# Patient Record
Sex: Male | Born: 1980 | Race: Black or African American | Hispanic: No | Marital: Single | Smoking: Current every day smoker
Health system: Southern US, Community
[De-identification: ages and names within clinical notes are randomized; demographics above are authoritative.]

## PROBLEM LIST (undated history)

## (undated) DIAGNOSIS — I10 Essential (primary) hypertension: Secondary | ICD-10-CM

## (undated) DIAGNOSIS — I509 Heart failure, unspecified: Secondary | ICD-10-CM

## (undated) HISTORY — DX: Heart failure, unspecified: I50.9

---

## 2017-04-15 ENCOUNTER — Encounter: Payer: Self-pay | Admitting: *Deleted

## 2017-04-15 ENCOUNTER — Emergency Department
Admission: EM | Admit: 2017-04-15 | Discharge: 2017-04-15 | Disposition: A | Discharge status: Home / Self Care | Payer: Self-pay | Attending: Emergency Medicine | Admitting: Emergency Medicine

## 2017-04-15 DIAGNOSIS — H00011 Hordeolum externum right upper eyelid: Secondary | ICD-10-CM | POA: Insufficient documentation

## 2017-04-15 DIAGNOSIS — F17200 Nicotine dependence, unspecified, uncomplicated: Secondary | ICD-10-CM | POA: Insufficient documentation

## 2017-04-15 DIAGNOSIS — H01001 Unspecified blepharitis right upper eyelid: Secondary | ICD-10-CM | POA: Insufficient documentation

## 2017-04-15 MED ORDER — GENTAMICIN SULFATE 0.3 % OP SOLN: 2.0000 [drp] | Freq: Four times a day (QID) | OPHTHALMIC | 0 refills | Status: AC

## 2017-04-15 MED ORDER — KETOROLAC TROMETHAMINE 0.5 % OP SOLN: 1.0000 [drp] | Freq: Four times a day (QID) | OPHTHALMIC | 0 refills | Status: DC

## 2017-04-15 MED ORDER — FLUORESCEIN SODIUM 0.6 MG OP STRP
1.0000 | ORAL_STRIP | Freq: Once | OPHTHALMIC | Status: DC
  Filled 2017-04-15: qty 1

## 2017-04-15 NOTE — ED Triage Notes (Signed)
Pt has swelling to right eye lid for 3 days. No known injury.  No visual changes.  Pt alert.

## 2017-04-15 NOTE — Discharge Instructions (Signed)
Use the prescription eye drops as directed. Apply warm compresses to promote healing. Follow-up with provider or [facility].

## 2017-04-15 NOTE — ED Provider Notes (Signed)
[facility] Emergency Department Provider Note ____________________________________________  Time seen: 1818  I have reviewed the triage vital signs and the nursing notes.  HISTORY  Chief Complaint  Eye Pain   HPI Andrew Walters is a 36 y.o. male presents to the ED for evaluation of swelling to the right upper lid. He denies vision change, tearing, matting, or crusting. He reports a foreign body sensation on Monday while at work. He works as a Investment banker, operational, and initially thought he had gotten flour in his eye. Since that time, he has noted continued eye irritation and whitish, mucoid discharge.   History reviewed. No pertinent past medical history.  There are no active problems to display for this patient.  History reviewed. No pertinent surgical history.  Prior to Admission medications   Not on File   Allergies Patient has no known allergies.  No family history on file.  Social History Social History  Substance Use Topics  . Smoking status: Current Every Day Smoker  . Smokeless tobacco: Never Used  . Alcohol use No    Review of Systems  Constitutional: Negative for fever. Eyes: Negative for visual changes. Right upper lid swelling as above ENT: Negative for sore throat. Gastrointestinal: Negative for abdominal pain, vomiting and diarrhea. Skin: Negative for rash. Neurological: Negative for headaches, focal weakness or numbness. ____________________________________________  PHYSICAL EXAM:  VITAL SIGNS: ED Triage Vitals  Enc Vitals Group     BP 04/15/17 1744 (!) 161/86     Pulse Rate 04/15/17 1744 93     Resp 04/15/17 1744 18     Temp 04/15/17 1744 99 F (37.2 C)     Temp Source 04/15/17 1744 Oral     SpO2 04/15/17 1744 97 %     Weight 04/15/17 1745 225 lb (102.1 kg)     Height 04/15/17 1745  (1.803 m)     Head Circumference --      Peak Flow --      Pain Score 04/15/17 1752 8     Pain Loc --      Pain Edu? --      Excl. in GC? --      Constitutional: Alert and oriented. Well appearing and in no distress. Head: Normocephalic and atraumatic. Eyes: Conjunctivae are normal bilaterally. PERRL. Normal extraocular movements. Upper right lid everted to reveal some cobblestone appearance to the palpebral conjunctiva. Exam reveals a spontaneously draining upper lid stye with blepharitis. No fluorescein dye uptake.  Hematological/Lymphatic/Immunological: No preauricular lymphadenopathy. Cardiovascular: Normal distal pulses. Respiratory: Normal respiratory effort.  Neurologic:  Normal gait without ataxia. Normal speech and language. No gross focal neurologic deficits are appreciated. Skin:  Skin is warm, dry and intact. No rash noted. ____________________________________________  PROCEDURES    Visual Acuity  Right Eye Distance: 20/70 Left Eye Distance: 20/40 Bilateral Distance:   ____________________________________________  INITIAL IMPRESSION / ASSESSMENT AND PLAN / ED COURSE  Patient with ED evaluation of right upper lid blepharitis and stye. He is discharged with a prescription for gentamycin and ketorolac solution. He is referred to [facility] for follow-up if needed.  ____________________________________________  FINAL CLINICAL IMPRESSION(S) / ED DIAGNOSES  Final diagnoses:  Blepharitis of right upper eyelid, unspecified type  Hordeolum externum of right upper eyelid      Karmen Stabs, Charlesetta Ivory, PA-C 04/15/17 1910    Karmen Stabs, Charlesetta Ivory, PA-C 04/15/17 1912    Rockne Menghini, MD 04/15/17 [phone]

## 2017-04-15 NOTE — ED Notes (Signed)
See triage note  Developed swelling and some pain to right eye 2-3 days ago  Low grade fever   No visual changes

## 2017-07-13 ENCOUNTER — Other Ambulatory Visit: Payer: Self-pay

## 2017-07-13 ENCOUNTER — Emergency Department: Payer: Self-pay

## 2017-07-13 ENCOUNTER — Emergency Department
Admission: EM | Admit: 2017-07-13 | Discharge: 2017-07-13 | Disposition: A | Discharge status: Home / Self Care | Payer: Self-pay | Attending: Emergency Medicine | Admitting: Emergency Medicine

## 2017-07-13 DIAGNOSIS — R1084 Generalized abdominal pain: Secondary | ICD-10-CM | POA: Insufficient documentation

## 2017-07-13 DIAGNOSIS — J029 Acute pharyngitis, unspecified: Secondary | ICD-10-CM | POA: Insufficient documentation

## 2017-07-13 DIAGNOSIS — Z79899 Other long term (current) drug therapy: Secondary | ICD-10-CM | POA: Insufficient documentation

## 2017-07-13 DIAGNOSIS — J40 Bronchitis, not specified as acute or chronic: Secondary | ICD-10-CM | POA: Insufficient documentation

## 2017-07-13 DIAGNOSIS — F17210 Nicotine dependence, cigarettes, uncomplicated: Secondary | ICD-10-CM | POA: Insufficient documentation

## 2017-07-13 LAB — CBC WITH DIFFERENTIAL/PLATELET
BASOS ABS: 0 10*3/uL (ref 0–0.1)
BASOS PCT: 1 %
Eosinophils Absolute: 0 10*3/uL (ref 0–0.7)
Eosinophils Relative: 1 %
HEMATOCRIT: 45.6 % (ref 40.0–52.0)
HEMOGLOBIN: 15.5 g/dL (ref 13.0–18.0)
Lymphocytes Relative: 14 %
Lymphs Abs: 1.3 10*3/uL (ref 1.0–3.6)
MCH: 31.7 pg (ref 26.0–34.0)
MCHC: 34 g/dL (ref 32.0–36.0)
MCV: 93.3 fL (ref 80.0–100.0)
MONOS PCT: 17 %
Monocytes Absolute: 1.6 10*3/uL — ABNORMAL HIGH (ref 0.2–1.0)
NEUTROS ABS: 6.5 10*3/uL (ref 1.4–6.5)
NEUTROS PCT: 69 %
Platelets: 239 10*3/uL (ref 150–440)
RBC: 4.89 MIL/uL (ref 4.40–5.90)
RDW: 14.5 % (ref 11.5–14.5)
WBC: 9.5 10*3/uL (ref 3.8–10.6)

## 2017-07-13 LAB — LACTIC ACID, PLASMA
LACTIC ACID, VENOUS: 0.7 mmol/L (ref 0.5–1.9)
Lactic Acid, Venous: 0.8 mmol/L (ref 0.5–1.9)

## 2017-07-13 LAB — COMPREHENSIVE METABOLIC PANEL
ALK PHOS: 58 U/L (ref 38–126)
ALT: 20 U/L (ref 17–63)
ANION GAP: 10 (ref 5–15)
AST: 23 U/L (ref 15–41)
Albumin: 4.1 g/dL (ref 3.5–5.0)
BUN: 13 mg/dL (ref 6–20)
CALCIUM: 8.9 mg/dL (ref 8.9–10.3)
CHLORIDE: 101 mmol/L (ref 101–111)
CO2: 25 mmol/L (ref 22–32)
Creatinine, Ser: 1.37 mg/dL — ABNORMAL HIGH (ref 0.61–1.24)
GFR calc non Af Amer: >60 mL/min (ref >60)
Glucose, Bld: 110 mg/dL — ABNORMAL HIGH (ref 65–99)
Potassium: 3.6 mmol/L (ref 3.5–5.1)
SODIUM: 136 mmol/L (ref 135–145)
Total Bilirubin: 0.9 mg/dL (ref 0.3–1.2)
Total Protein: 8 g/dL (ref 6.5–8.1)

## 2017-07-13 LAB — URINALYSIS, COMPLETE (UACMP) WITH MICROSCOPIC
BACTERIA UA: NONE SEEN
BILIRUBIN URINE: NEGATIVE
Glucose, UA: NEGATIVE mg/dL
KETONES UR: NEGATIVE mg/dL
LEUKOCYTES UA: NEGATIVE
NITRITE: NEGATIVE
PROTEIN: 30 mg/dL — AB
Specific Gravity, Urine: 1.024 (ref 1.005–1.030)
pH: 6 (ref 5.0–8.0)

## 2017-07-13 LAB — INFLUENZA PANEL BY PCR (TYPE A & B)
INFLAPCR: NEGATIVE
Influenza B By PCR: NEGATIVE

## 2017-07-13 LAB — POCT RAPID STREP A: Streptococcus, Group A Screen (Direct): NEGATIVE

## 2017-07-13 MED ORDER — SODIUM CHLORIDE 0.9 % IV BOLUS (SEPSIS)
1000.0000 mL | Freq: Once | INTRAVENOUS | Status: AC
  Administered 2017-07-13: 1000 mL via INTRAVENOUS

## 2017-07-13 MED ORDER — CEFTRIAXONE SODIUM IN DEXTROSE 20 MG/ML IV SOLN
1.0000 g | Freq: Once | INTRAVENOUS | Status: AC
  Administered 2017-07-13: 1 g via INTRAVENOUS
  Filled 2017-07-13: qty 50

## 2017-07-13 MED ORDER — KETOROLAC TROMETHAMINE 30 MG/ML IJ SOLN
30.0000 mg | Freq: Once | INTRAMUSCULAR | Status: AC
  Administered 2017-07-13: 30 mg via INTRAVENOUS
  Filled 2017-07-13: qty 1

## 2017-07-13 MED ORDER — ONDANSETRON 4 MG PO TBDP: 4.0000 mg | ORAL_TABLET | Freq: Three times a day (TID) | ORAL | 0 refills | Status: DC | PRN

## 2017-07-13 MED ORDER — ACETAMINOPHEN 325 MG PO TABS
ORAL_TABLET | ORAL | Status: AC
  Administered 2017-07-13: 650 mg via ORAL
  Filled 2017-07-13: qty 2

## 2017-07-13 MED ORDER — IOPAMIDOL (ISOVUE-300) INJECTION 61%
100.0000 mL | Freq: Once | INTRAVENOUS | Status: AC | PRN
  Administered 2017-07-13: 100 mL via INTRAVENOUS

## 2017-07-13 MED ORDER — ACETAMINOPHEN 325 MG PO TABS
650.0000 mg | ORAL_TABLET | Freq: Once | ORAL | Status: AC | PRN
  Administered 2017-07-13: 650 mg via ORAL

## 2017-07-13 MED ORDER — AMOXICILLIN-POT CLAVULANATE 875-125 MG PO TABS: 1.0000 | ORAL_TABLET | Freq: Two times a day (BID) | ORAL | 0 refills | Status: AC

## 2017-07-13 MED ORDER — DEXTROSE 5 % IV SOLN
500.0000 mg | Freq: Once | INTRAVENOUS | Status: AC
  Administered 2017-07-13: 500 mg via INTRAVENOUS
  Filled 2017-07-13: qty 500

## 2017-07-13 NOTE — ED Notes (Signed)
Patient transported to CT 

## 2017-07-13 NOTE — ED Notes (Signed)
Flu swab sent.

## 2017-07-13 NOTE — ED Provider Notes (Addendum)
[facility] Emergency Department Provider Note    First MD Initiated Contact with Patient 07/13/17 1111     (approximate)  I have reviewed the triage vital signs and the nursing notes.   HISTORY  Chief Complaint Sore Throat; Back Pain; and Abdominal Pain    HPI Andrew Walters is a 36 y.o. male presents to the emergency department presents to the emergency department with medical complaints including cough, sore throat "flulike symptoms", generalized abdominal and low back pain, nausea vomiting and diarrhea times 1 week.  Patient noted to be febrile on presentation temperature 100.4.  Patient current pain score 9 out of 10.   Past medical history Influenza with pneumonia There are no active problems to display for this patient.   History reviewed. No pertinent surgical history.  Prior to Admission medications   Medication Sig Start Date End Date Taking? Authorizing Provider  ketorolac (ACULAR) 0.5 % ophthalmic solution Place 1 drop into the right eye 4 (four) times daily. 04/15/17   Menshew, Charlesetta Ivory, PA-C    Allergies No known drug allergies  No family history on file.  Social History Social History   Tobacco Use  . Smoking status: Current Every Day Smoker  . Smokeless tobacco: Never Used  Substance Use Topics  . Alcohol use: No  . Drug use: No    Review of Systems Constitutional: Positive for fever/chills Eyes: No visual changes. ENT: Positive for sore throat. Cardiovascular: Denies chest pain. Respiratory: Denies shortness of breath.  Positive for cough Gastrointestinal: Positive for abdominal pain nausea vomiting diarrhea genitourinary: Negative for dysuria. Musculoskeletal: Negative for neck pain.  Negative for back pain. Integumentary: Negative for rash. Neurological: Negative for headaches, focal weakness or numbness.   ____________________________________________   PHYSICAL EXAM:  VITAL SIGNS: ED Triage Vitals  Enc  Vitals Group     BP 07/13/17 1052 (!) 145/101     Pulse Rate 07/13/17 1052 (!) 108     Resp 07/13/17 1052 (!) 22     Temp 07/13/17 1052 (!) 100.4 F (38 C)     Temp Source 07/13/17 1052 Oral     SpO2 07/13/17 1052 98 %     Weight 07/13/17 1053 102.1 kg (225 lb)     Height 07/13/17 1053 1.803 m (5\' 11" )     Head Circumference --      Peak Flow --      Pain Score 07/13/17 1052 8     Pain Loc --      Pain Edu? --      Excl. in GC? --     Constitutional: Alert and oriented. Well appearing and in no acute distress. Eyes: Conjunctivae are normal.  Head: Atraumatic. Mouth/Throat: Mucous membranes are moist.  Pharyngeal erythema no exudates noted.   Neck: No stridor.  No meningeal signs.  Cardiovascular: Normal rate, regular rhythm. Good peripheral circulation. Grossly normal heart sounds. Respiratory: Normal respiratory effort.  No retractions. Lungs CTAB. Gastrointestinal: Generalized tenderness to palpation no distention.  Musculoskeletal: No lower extremity tenderness nor edema. No gross deformities of extremities. Neurologic:  Normal speech and language. No gross focal neurologic deficits are appreciated.  Skin:  Skin is warm, dry and intact. No rash noted. Psychiatric: Mood and affect are normal. Speech and behavior are normal.  ____________________________________________   LABS (all labs ordered are listed, but only abnormal results are displayed)  Labs Reviewed  CBC WITH DIFFERENTIAL/PLATELET - Abnormal; Notable for the following components:      Result  Value   Monocytes Absolute 1.6 (*)    All other components within normal limits  URINALYSIS, COMPLETE (UACMP) WITH MICROSCOPIC - Abnormal; Notable for the following components:   Color, Urine YELLOW (*)    APPearance HAZY (*)    Hgb urine dipstick MODERATE (*)    Protein, ur 30 (*)    Squamous Epithelial / LPF 0-5 (*)    All other components within normal limits  LACTIC ACID, PLASMA  LACTIC ACID, PLASMA    COMPREHENSIVE METABOLIC PANEL  INFLUENZA PANEL BY PCR (TYPE A & B)   __  RADIOLOGY I, [place] N BROWN, personally viewed and evaluated these images (plain radiographs) as part of my medical decision making, as well as reviewing the written report by the radiologist.  CLINICAL DATA:  36 year old male with sore throat and flu-like symptoms with lower back pain. Nausea and vomiting with bilateral lower abdominal pain for the past week. Initial encounter.  EXAM: CT ABDOMEN AND PELVIS WITH CONTRAST  TECHNIQUE: Multidetector CT imaging of the abdomen and pelvis was performed using the standard protocol following bolus administration of intravenous contrast.  CONTRAST:  100mL ISOVUE-300 IOPAMIDOL (ISOVUE-300) INJECTION 61%  COMPARISON:  None.  FINDINGS: Lower chest: No worrisome lung base abnormality. Heart size top-normal.  Hepatobiliary: No worrisome hepatic lesion or calcified gallstones  Pancreas: No pancreatic mass or inflammation  Spleen: No splenic mass or enlargement  Adrenals/Urinary Tract: No obstructing stone or hydronephrosis. Tiny nonobstructing right renal calculi. Bilateral renal low-density structures some of which are cysts and others too small to characterize but statistically likely cysts.  No adrenal mass.  Noncontrast filled views of the urinary bladder unremarkable  Stomach/Bowel: No extraluminal bowel inflammatory process identified. Specifically, no inflammation surrounds the appendix.  Fluid-filled small bowel loops may be normal versus changes of enteritis.  No gastric abnormality identified.  Vascular/Lymphatic: No aortic aneurysm or large vessel occlusion. Duplicated right renal artery incidentally noted.  No adenopathy.  Reproductive: No worrisome abnormality  Other: No free air or bowel containing hernia.  Musculoskeletal: No worrisome osseous lesion.  IMPRESSION: No extraluminal bowel inflammatory process  identified. Specifically, no inflammation surrounds the appendix.  Fluid-filled small bowel loops may be normal versus changes of enteritis.  No obstructing stone or hydronephrosis. Tiny nonobstructing right renal calculi.  Bilateral renal low-density structures some of which are cysts and others too small to characterize but statistically likely cysts.   Electronically Signed   By: Lacy DuverneySteven  Olson M.D.   On: 07/13/2017 13:37   Procedures   ____________________________________________   INITIAL IMPRESSION / ASSESSMENT AND PLAN / ED COURSE  As part of my medical decision making, I reviewed the following data within the electronic MEDICAL RECORD NUMBER [id number] year old male presented with above-stated history and physical exam concerning for possible pharyngitis bronchitis most likely viral etiology.  Influenza negative laboratory data unremarkable with exception of creatinine 1.37.  Patient received 2 L IV normal saline and Toradol IV with marked improvement of pain except for abdominal discomfort as such CT scan of the abdomen performed which was consistent with enteritis.    ____________________________________________  FINAL CLINICAL IMPRESSION(S) / ED DIAGNOSES  Final diagnoses:  Pharyngitis, unspecified etiology  Bronchitis     MEDICATIONS GIVEN DURING THIS VISIT:  Medications  sodium chloride 0.9 % bolus 1,000 mL (not administered)  sodium chloride 0.9 % bolus 1,000 mL (not administered)  ketorolac (TORADOL) 30 MG/ML injection 30 mg (not administered)  acetaminophen (TYLENOL) tablet 650 mg (650 mg Oral Given 07/13/17 1059)  ED Discharge Orders    None       Note:  This document was prepared using Dragon voice recognition software and may include unintentional dictation errors.    Darci CurrentBrown, [place] N, MD 07/21/17 2049    Darci CurrentBrown, [place] N, MD 07/21/17 2049

## 2017-07-13 NOTE — ED Triage Notes (Addendum)
Pt to ER via POV c/o sore throat, flu like sx, lower back pain, NV, bilateral lower abdominal pain X 1 week. No medications PTA.

## 2017-11-12 ENCOUNTER — Emergency Department
Admission: EM | Admit: 2017-11-12 | Discharge: 2017-11-13 | Disposition: A | Discharge status: Home / Self Care | Payer: Self-pay | Attending: Student in an Organized Health Care Education/Training Program | Admitting: Student in an Organized Health Care Education/Training Program

## 2017-11-12 ENCOUNTER — Other Ambulatory Visit: Payer: Self-pay

## 2017-11-12 DIAGNOSIS — Y92000 Kitchen of unspecified non-institutional (private) residence as  the place of occurrence of the external cause: Secondary | ICD-10-CM | POA: Insufficient documentation

## 2017-11-12 DIAGNOSIS — W260XXA Contact with knife, initial encounter: Secondary | ICD-10-CM | POA: Insufficient documentation

## 2017-11-12 DIAGNOSIS — F17200 Nicotine dependence, unspecified, uncomplicated: Secondary | ICD-10-CM | POA: Insufficient documentation

## 2017-11-12 DIAGNOSIS — Y999 Unspecified external cause status: Secondary | ICD-10-CM | POA: Insufficient documentation

## 2017-11-12 DIAGNOSIS — Z23 Encounter for immunization: Secondary | ICD-10-CM | POA: Insufficient documentation

## 2017-11-12 DIAGNOSIS — S61226A Laceration with foreign body of right little finger without damage to nail, initial encounter: Secondary | ICD-10-CM | POA: Insufficient documentation

## 2017-11-12 DIAGNOSIS — Y93G1 Activity, food preparation and clean up: Secondary | ICD-10-CM | POA: Insufficient documentation

## 2017-11-12 DIAGNOSIS — S61216A Laceration without foreign body of right little finger without damage to nail, initial encounter: Secondary | ICD-10-CM

## 2017-11-12 MED ORDER — CEPHALEXIN 500 MG PO CAPS: 500.0000 mg | ORAL_CAPSULE | Freq: Four times a day (QID) | ORAL | 0 refills | Status: AC

## 2017-11-12 MED ORDER — CEPHALEXIN 500 MG PO CAPS
500.0000 mg | ORAL_CAPSULE | Freq: Once | ORAL | Status: AC
  Administered 2017-11-12: 500 mg via ORAL
  Filled 2017-11-12: qty 1

## 2017-11-12 MED ORDER — TETANUS-DIPHTH-ACELL PERTUSSIS 5-2.5-18.5 LF-MCG/0.5 IM SUSP
0.5000 mL | Freq: Once | INTRAMUSCULAR | Status: AC
  Administered 2017-11-13: 0.5 mL via INTRAMUSCULAR
  Filled 2017-11-12: qty 0.5

## 2017-11-12 NOTE — Discharge Instructions (Signed)
Please keep hand clean and dry for 1 week.  Take antibiotics as prescribed.  If any redness, drainage, is increase in swelling return to the ED.

## 2017-11-12 NOTE — ED Provider Notes (Signed)
[facility] EMERGENCY DEPARTMENT Provider Note   CSN: [id number] Arrival date & time: 11/12/17  2319     History   Chief Complaint Chief Complaint  Patient presents with  . Laceration    HPI Andrew Walters is a 37 y.o. male presents to the emergency department for evaluation of laceration to the right fifth digit.  Patient states he was washing dishes at home and may be cut his fifth digit on a knife.  Laceration is along the base of the right fifth digit at the MCP joint.  No loss of flexion of the fifth digit.  No numbness or tingling.  Laceration occurred just prior to arrival.  His tetanus is not up-to-date.  HPI  No past medical history on file.  There are no active problems to display for this patient.   No past surgical history on file.      Home Medications    Prior to Admission medications   Medication Sig Start Date End Date Taking? Authorizing Provider  cephALEXin (KEFLEX) 500 MG capsule Take 1 capsule (500 mg total) by mouth 4 (four) times daily for 10 days. 11/12/17 11/22/17  Evon Slack, PA-C  ketorolac (ACULAR) 0.5 % ophthalmic solution Place 1 drop into the right eye 4 (four) times daily. 04/15/17   Menshew, Charlesetta Ivory, PA-C  ondansetron (ZOFRAN ODT) 4 MG disintegrating tablet Take 1 tablet (4 mg total) by mouth every 8 (eight) hours as needed for nausea or vomiting. 07/13/17   Darci Current, MD    Family History No family history on file.  Social History Social History   Tobacco Use  . Smoking status: Current Every Day Smoker  . Smokeless tobacco: Never Used  Substance Use Topics  . Alcohol use: No  . Drug use: No     Allergies   Patient has no known allergies.   Review of Systems Review of Systems  Constitutional: Negative for fever.  Musculoskeletal: Negative for arthralgias, back pain, gait problem, joint swelling, myalgias and neck pain.  Skin: Positive for wound.  Neurological: Negative for numbness.      Physical Exam Updated Vital Signs BP (!) 151/98   Pulse 96   Temp 98 F (36.7 C) (Oral)   Resp 16   Ht 5\' 11"  (1.803 m)   Wt 93 kg (205 lb)   SpO2 100%   BMI 28.59 kg/m   Physical Exam  Constitutional: He is oriented to person, place, and time. He appears well-developed and well-nourished.  HENT:  Head: Normocephalic and atraumatic.  Eyes: Conjunctivae are normal.  Neck: Normal range of motion.  Cardiovascular: Normal rate.  Pulmonary/Chest: Effort normal. No respiratory distress.  Musculoskeletal: Normal range of motion.  Small flap-like laceration along the volar aspect of the right fifth digit at the base of the proximal phalanx.  Full active flexion.  Sensation intact distally.  No visible or palpable foreign body.  Hand soaked in Betadine and saline.  Bleeding well controlled.  Neurological: He is alert and oriented to person, place, and time.  Skin: Skin is warm. No rash noted.  Psychiatric: He has a normal mood and affect. His behavior is normal. Thought content normal.     ED Treatments / Results  Labs (all labs ordered are listed, but only abnormal results are displayed) Labs Reviewed - No data to display  EKG None  Radiology No results found.  Procedures .Marland KitchenLaceration Repair Date/Time: 11/12/2017 11:58 PM Performed by: Evon Slack, PA-C Authorized by:  Evon SlackGaines, Brandie Lopes C, PA-C   Consent:    Consent obtained:  Verbal   Consent given by:  Patient   Risks discussed:  Infection, pain, retained foreign body, poor cosmetic result and poor wound healing Repair type:    Repair type:  Simple Exploration:    Hemostasis achieved with:  Direct pressure   Wound exploration: entire depth of wound probed and visualized     Contaminated: no   Treatment:    Area cleansed with:  Saline and Betadine   Amount of cleaning:  Extensive   Irrigation solution:  Sterile saline   Visualized foreign bodies/material removed: no   Skin repair:    Repair method:   Tissue adhesive Approximation:    Approximation:  Close Post-procedure details:    Dressing:  Sterile dressing   Patient tolerance of procedure:  Tolerated well, no immediate complications   (including critical care time)  Medications Ordered in ED Medications  cephALEXin (KEFLEX) capsule 500 mg (has no administration in time range)  Tdap (BOOSTRIX) injection 0.5 mL (has no administration in time range)     Initial Impression / Assessment and Plan / ED Course  I have reviewed the triage vital signs and the nursing notes.  Pertinent labs & imaging results that were available during my care of the patient were reviewed by me and considered in my medical decision making (see chart for details).     37 year old male with laceration to the volar aspect of the right fifth digit.  No tendon deficits noted.  Sensation intact distally.  Wound thoroughly cleansed and repaired with Dermabond.  Band-Aid applied.  Patient educated on wound care.  He is placed on prophylactic antibiotics due to dirty dishwater contamination.  He is educated on signs symptoms return to ED for.  Final Clinical Impressions(s) / ED Diagnoses   Final diagnoses:  Laceration of right little finger without foreign body without damage to nail, initial encounter    ED Discharge Orders        Ordered    cephALEXin (KEFLEX) 500 MG capsule  4 times daily     11/12/17 2339       Ronnette JuniperGaines, Jahmeek Shirk C, PA-C 11/12/17 2359    Willy Eddyobinson, Patrick, MD 11/14/17 1112

## 2017-11-12 NOTE — ED Triage Notes (Signed)
Pt lacerated base of right fifth finger with silverware at 2300. Bleeding controlled, cms intact to fingers.

## 2019-04-04 ENCOUNTER — Emergency Department
Admission: EM | Admit: 2019-04-04 | Discharge: 2019-04-04 | Disposition: A | Discharge status: Home / Self Care | Payer: Self-pay | Attending: Emergency Medicine | Admitting: Emergency Medicine

## 2019-04-04 ENCOUNTER — Other Ambulatory Visit: Payer: Self-pay

## 2019-04-04 DIAGNOSIS — R197 Diarrhea, unspecified: Secondary | ICD-10-CM | POA: Insufficient documentation

## 2019-04-04 DIAGNOSIS — F17200 Nicotine dependence, unspecified, uncomplicated: Secondary | ICD-10-CM | POA: Insufficient documentation

## 2019-04-04 DIAGNOSIS — Z20828 Contact with and (suspected) exposure to other viral communicable diseases: Secondary | ICD-10-CM | POA: Insufficient documentation

## 2019-04-04 DIAGNOSIS — R112 Nausea with vomiting, unspecified: Secondary | ICD-10-CM | POA: Insufficient documentation

## 2019-04-04 LAB — COMPREHENSIVE METABOLIC PANEL
ALT: 18 U/L (ref 0–44)
AST: 24 U/L (ref 15–41)
Albumin: 4.5 g/dL (ref 3.5–5.0)
Alkaline Phosphatase: 66 U/L (ref 38–126)
Anion gap: 11 (ref 5–15)
BUN: 10 mg/dL (ref 6–20)
CO2: 24 mmol/L (ref 22–32)
Calcium: 9 mg/dL (ref 8.9–10.3)
Chloride: 102 mmol/L (ref 98–111)
Creatinine, Ser: 1.15 mg/dL (ref 0.61–1.24)
GFR calc Af Amer: >60 mL/min (ref >60)
GFR calc non Af Amer: >60 mL/min (ref >60)
Glucose, Bld: 105 mg/dL — ABNORMAL HIGH (ref 70–99)
Potassium: 3.2 mmol/L — ABNORMAL LOW (ref 3.5–5.1)
Sodium: 137 mmol/L (ref 135–145)
Total Bilirubin: 0.8 mg/dL (ref 0.3–1.2)
Total Protein: 8 g/dL (ref 6.5–8.1)

## 2019-04-04 LAB — CBC
HCT: 43.7 % (ref 39.0–52.0)
Hemoglobin: 15.2 g/dL (ref 13.0–17.0)
MCH: 31.9 pg (ref 26.0–34.0)
MCHC: 34.8 g/dL (ref 30.0–36.0)
MCV: 91.8 fL (ref 80.0–100.0)
Platelets: 270 10*3/uL (ref 150–400)
RBC: 4.76 MIL/uL (ref 4.22–5.81)
RDW: 13.9 % (ref 11.5–15.5)
WBC: 14.4 10*3/uL — ABNORMAL HIGH (ref 4.0–10.5)
nRBC: 0 % (ref 0.0–0.2)

## 2019-04-04 LAB — URINALYSIS, COMPLETE (UACMP) WITH MICROSCOPIC
Bilirubin Urine: NEGATIVE
Glucose, UA: NEGATIVE mg/dL
Ketones, ur: NEGATIVE mg/dL
Leukocytes,Ua: NEGATIVE
Nitrite: NEGATIVE
Protein, ur: NEGATIVE mg/dL
Specific Gravity, Urine: 1.012 (ref 1.005–1.030)
pH: 6 (ref 5.0–8.0)

## 2019-04-04 LAB — LIPASE, BLOOD: Lipase: 18 U/L (ref 11–51)

## 2019-04-04 LAB — SARS CORONAVIRUS 2 BY RT PCR (HOSPITAL ORDER, PERFORMED IN ~~LOC~~ HOSPITAL LAB): SARS Coronavirus 2: NEGATIVE

## 2019-04-04 MED ORDER — SODIUM CHLORIDE 0.9 % IV BOLUS
1000.0000 mL | Freq: Once | INTRAVENOUS | Status: AC
  Administered 2019-04-04: 14:00:00 1000 mL via INTRAVENOUS

## 2019-04-04 MED ORDER — SODIUM CHLORIDE 0.9 % IV BOLUS
1000.0000 mL | Freq: Once | INTRAVENOUS | Status: AC
  Administered 2019-04-04: 15:00:00 1000 mL via INTRAVENOUS

## 2019-04-04 MED ORDER — ONDANSETRON 4 MG PO TBDP: 4.0000 mg | ORAL_TABLET | Freq: Three times a day (TID) | ORAL | 0 refills | Status: DC | PRN

## 2019-04-04 MED ORDER — AMLODIPINE BESYLATE 5 MG PO TABS
10.0000 mg | ORAL_TABLET | Freq: Once | ORAL | Status: AC
  Administered 2019-04-04: 15:00:00 10 mg via ORAL
  Filled 2019-04-04: qty 2

## 2019-04-04 MED ORDER — MORPHINE SULFATE (PF) 4 MG/ML IV SOLN
4.0000 mg | Freq: Once | INTRAVENOUS | Status: AC
  Administered 2019-04-04: 14:00:00 4 mg via INTRAVENOUS
  Filled 2019-04-04: qty 1

## 2019-04-04 MED ORDER — ACETAMINOPHEN 500 MG PO TABS
1000.0000 mg | ORAL_TABLET | Freq: Once | ORAL | Status: AC
  Administered 2019-04-04: 18:00:00 1000 mg via ORAL
  Filled 2019-04-04: qty 2

## 2019-04-04 MED ORDER — LOPERAMIDE HCL 2 MG PO TABS: 2.0000 mg | ORAL_TABLET | Freq: Four times a day (QID) | ORAL | 0 refills | Status: DC | PRN

## 2019-04-04 MED ORDER — AMLODIPINE BESYLATE 10 MG PO TABS: 10.0000 mg | ORAL_TABLET | Freq: Every day | ORAL | 1 refills | Status: DC

## 2019-04-04 MED ORDER — ONDANSETRON HCL 4 MG/2ML IJ SOLN
4.0000 mg | Freq: Once | INTRAMUSCULAR | Status: AC
  Administered 2019-04-04: 14:00:00 4 mg via INTRAVENOUS
  Filled 2019-04-04: qty 2

## 2019-04-04 NOTE — ED Notes (Signed)
Dr. Kerman Passey informed of elevated BP, see new orders

## 2019-04-04 NOTE — ED Notes (Signed)
Pt c/o body aches returning, temp 99.4, Dr. Kerman Passey informed, verbal given for 1g tylenol PO

## 2019-04-04 NOTE — ED Notes (Signed)
covid swab walked to the lab

## 2019-04-04 NOTE — ED Notes (Signed)
Pt advised that urine sample is needed, pt voices understanding

## 2019-04-04 NOTE — ED Triage Notes (Addendum)
Pt arrives via [facility] from home for nausea and vomiting since yesterday afternoon. Pt reports body aches and chills. Pt reports he has not had any known exposure to covid. PT A&OX3 in NAD. Reports continuous vomiting and diarrhea. Pt hypertensive in route per EMS, BP 182/116 on arrival. PT c/o congestion and cough x 1 week

## 2019-04-04 NOTE — Discharge Instructions (Signed)
As we discussed please take your blood pressure medication each day, follow-up with a primary care doctor in the next 2 weeks for recheck/reevaluation.  Please check your blood pressure each day in write down the values to bring to your primary care doctor.  Please take your nausea medicine and antidiarrheal medication as needed as written.  Please drink plenty of fluids and obtain plenty of rest.  Return to the emergency department for any symptom personally concerning to yourself.

## 2019-04-04 NOTE — ED Provider Notes (Signed)
[facility] Emergency Department Provider Note  Time seen: 1:32 PM  I have reviewed the triage vital signs and the nursing notes.   HISTORY  Chief Complaint Emesis   HPI Mikle BosworthRyan Hefty is a 38 y.o. male with no significant past medical history presents to the emergency department for body aches cough nausea vomiting diarrhea.  According to the patient since this morning he has been nauseated frequent episodes of vomiting and diarrhea.  Developed diffuse body aches subjective fever and occasional dry cough per patient.  No known sick contacts.  No chest pain.  States mild upper abdominal discomfort due to vomiting per patient.   History reviewed. No pertinent past medical history.  There are no active problems to display for this patient.   History reviewed. No pertinent surgical history.  Prior to Admission medications   Not on File    No Known Allergies  History reviewed. No pertinent family history.  Social History Social History   Tobacco Use  . Smoking status: Current Every Day Smoker  . Smokeless tobacco: Never Used  Substance Use Topics  . Alcohol use: No  . Drug use: No    Review of Systems Constitutional: Subjective low-grade fever ENT: Negative for recent illness/congestion Cardiovascular: Negative for chest pain. Respiratory: Negative for shortness of breath.  Positive for cough. Gastrointestinal: Mild dull upper abdominal discomfort which patient relates to vomiting.  Positive for nausea vomiting diarrhea since this morning. Genitourinary: Negative for urinary compaints Musculoskeletal: Positive for body aches. Skin: Negative for skin complaints  Neurological: Negative for headache All other ROS negative  ____________________________________________   PHYSICAL EXAM:  VITAL SIGNS: ED Triage Vitals  Enc Vitals Group     BP 04/04/19 1309 (!) 182/116     Pulse Rate 04/04/19 1309 95     Resp 04/04/19 1309 (!) 22     Temp  04/04/19 1309 98.5 F (36.9 C)     Temp Source 04/04/19 1309 Oral     SpO2 04/04/19 1309 96 %     Weight 04/04/19 1310 225 lb (102.1 kg)     Height 04/04/19 1310 5\' 11"  (1.803 m)     Head Circumference --      Peak Flow --      Pain Score 04/04/19 1309 8     Pain Loc --      Pain Edu? --      Excl. in GC? --    Constitutional: Alert and oriented. Well appearing and in no distress. Eyes: Normal exam ENT      Head: Normocephalic and atraumatic.      Mouth/Throat: Mucous membranes are moist. Cardiovascular: Normal rate, regular rhythm. No murmur Respiratory: Normal respiratory effort without tachypnea nor retractions. Breath sounds are clear  Gastrointestinal: Soft and nontender. No distention.   Musculoskeletal: Nontender with normal range of motion in all extremities.  Neurologic:  Normal speech and language. No gross focal neurologic deficits Skin:  Skin is warm, dry and intact.  Psychiatric: Mood and affect are normal.   ____________________________________________   INITIAL IMPRESSION / ASSESSMENT AND PLAN / ED COURSE  Pertinent labs & imaging results that were available during my care of the patient were reviewed by me and considered in my medical decision making (see chart for details).   Patient presents emergency department for nausea vomiting diarrhea upper abdominal discomfort, body aches subjective fever and dry cough.  Differential would include viral infection such as COVID-19, gastroenteritis, gastritis, other infectious etiology such as UTI.  We will check labs, IV hydrate, treat pain and nausea while awaiting lab results.  Patient agreeable to plan of care.  We will also check for COVID.  Patient's work-up is largely reassuring besides mild leukocytosis.  Patient is quite hypertensive states he is not taking any medications currently.  We will start the patient on Norvasc 10 mg daily and the patient is to check his blood pressure each day keep a journal and follow-up  with a primary care doctor within the next 2 to 3 weeks.  Patient agreeable.  I discussed hydration and supportive care at home.  We will discharge with nausea medication and loperamide.  Discussed return precautions.  Caison Hearn was evaluated in Emergency Department on 04/04/2019 for the symptoms described in the history of present illness. He was evaluated in the context of the global COVID-19 pandemic, which necessitated consideration that the patient might be at risk for infection with the SARS-CoV-2 virus that causes COVID-19. Institutional protocols and algorithms that pertain to the evaluation of patients at risk for COVID-19 are in a state of rapid change based on information released by regulatory bodies including the CDC and federal and state organizations. These policies and algorithms were followed during the patient's care in the ED.  ____________________________________________   FINAL CLINICAL IMPRESSION(S) / ED DIAGNOSES  Nausea vomiting diarrhea Hypertension   Harvest Dark, MD 04/04/19 1645

## 2019-04-05 ENCOUNTER — Encounter: Payer: Self-pay | Admitting: Emergency Medicine

## 2019-04-05 ENCOUNTER — Emergency Department
Admission: EM | Admit: 2019-04-05 | Discharge: 2019-04-05 | Disposition: A | Discharge status: Home / Self Care | Payer: Self-pay | Attending: Emergency Medicine | Admitting: Emergency Medicine

## 2019-04-05 DIAGNOSIS — F17210 Nicotine dependence, cigarettes, uncomplicated: Secondary | ICD-10-CM | POA: Insufficient documentation

## 2019-04-05 DIAGNOSIS — K529 Noninfective gastroenteritis and colitis, unspecified: Secondary | ICD-10-CM | POA: Insufficient documentation

## 2019-04-05 LAB — COMPREHENSIVE METABOLIC PANEL
ALT: 18 U/L (ref 0–44)
AST: 21 U/L (ref 15–41)
Albumin: 4.9 g/dL (ref 3.5–5.0)
Alkaline Phosphatase: 72 U/L (ref 38–126)
Anion gap: 12 (ref 5–15)
BUN: 10 mg/dL (ref 6–20)
CO2: 25 mmol/L (ref 22–32)
Calcium: 9.4 mg/dL (ref 8.9–10.3)
Chloride: 102 mmol/L (ref 98–111)
Creatinine, Ser: 1.15 mg/dL (ref 0.61–1.24)
GFR calc Af Amer: >60 mL/min (ref >60)
GFR calc non Af Amer: >60 mL/min (ref >60)
Glucose, Bld: 126 mg/dL — ABNORMAL HIGH (ref 70–99)
Potassium: 3.3 mmol/L — ABNORMAL LOW (ref 3.5–5.1)
Sodium: 139 mmol/L (ref 135–145)
Total Bilirubin: 0.8 mg/dL (ref 0.3–1.2)
Total Protein: 9.2 g/dL — ABNORMAL HIGH (ref 6.5–8.1)

## 2019-04-05 LAB — CBC
HCT: 49.4 % (ref 39.0–52.0)
Hemoglobin: 16.7 g/dL (ref 13.0–17.0)
MCH: 31.3 pg (ref 26.0–34.0)
MCHC: 33.8 g/dL (ref 30.0–36.0)
MCV: 92.5 fL (ref 80.0–100.0)
Platelets: 287 10*3/uL (ref 150–400)
RBC: 5.34 MIL/uL (ref 4.22–5.81)
RDW: 13.6 % (ref 11.5–15.5)
WBC: 13.1 10*3/uL — ABNORMAL HIGH (ref 4.0–10.5)
nRBC: 0 % (ref 0.0–0.2)

## 2019-04-05 LAB — LIPASE, BLOOD: Lipase: 23 U/L (ref 11–51)

## 2019-04-05 NOTE — ED Provider Notes (Signed)
[facility] Emergency Department Provider Note   ____________________________________________    I have reviewed the triage vital signs and the nursing notes.   HISTORY  Chief Complaint Back Pain, Generalized Body Aches, and Emesis     HPI Andrew Walters is a 38 y.o. male who presents with complaints of nausea vomiting diarrhea, abdominal cramping, body aches.  Patient was seen here yesterday for similar symptoms.  He is frustrated that he is not feeling better today.  Did have screening COVID swab yesterday which was negative.  Denies fevers.  Abdomen feels sore, diffusely.  Nonbloody vomitus, nonbloody stools.  Has been taking Tylenol occasionally  History reviewed. No pertinent past medical history.  There are no active problems to display for this patient.   History reviewed. No pertinent surgical history.  Prior to Admission medications   Medication Sig Start Date End Date Taking? Authorizing Provider  amLODipine (NORVASC) 10 MG tablet Take 1 tablet (10 mg total) by mouth daily. 04/04/19 06/03/19  Minna AntisPaduchowski, Kevin, MD  loperamide (IMODIUM A-D) 2 MG tablet Take 1 tablet (2 mg total) by mouth 4 (four) times daily as needed for diarrhea or loose stools. 04/04/19   Minna AntisPaduchowski, Kevin, MD  ondansetron (ZOFRAN ODT) 4 MG disintegrating tablet Take 1 tablet (4 mg total) by mouth every 8 (eight) hours as needed for nausea or vomiting. 04/04/19   Minna AntisPaduchowski, Kevin, MD     Allergies Patient has no known allergies.  No family history on file.  Social History Social History   Tobacco Use  . Smoking status: Current Every Day Smoker  . Smokeless tobacco: Never Used  Substance Use Topics  . Alcohol use: No  . Drug use: No    Review of Systems  Constitutional: No fever/chills Eyes: No visual changes.  ENT: No sore throat. Cardiovascular: Denies chest pain. Respiratory: Denies shortness of breath. Gastrointestinal: As above Genitourinary: Negative  for dysuria. Musculoskeletal: Negative for back pain. Skin: Negative for rash. Neurological: Negative for headaches   ____________________________________________   PHYSICAL EXAM:  VITAL SIGNS: ED Triage Vitals  Enc Vitals Group     BP 04/05/19 1124 (!) 159/103     Pulse Rate 04/05/19 1124 93     Resp 04/05/19 1124 16     Temp 04/05/19 1124 97.8 F (36.6 C)     Temp Source 04/05/19 1124 Oral     SpO2 04/05/19 1124 98 %     Weight 04/05/19 1125 102 kg (224 lb 13.9 oz)     Height 04/05/19 1125 1.803 m (5\' 11" )     Head Circumference --      Peak Flow --      Pain Score 04/05/19 1125 8     Pain Loc --      Pain Edu? --      Excl. in GC? --     Constitutional: Alert and oriented. Eyes: Conjunctivae are normal.  .Nose: No congestion/rhinnorhea. Mouth/Throat: Mucous membranes are moist.    Cardiovascular: Normal rate, regular rhythm. Grossly normal heart sounds.  Good peripheral circulation. Respiratory: Normal respiratory effort.  No retractions. Lungs CTAB. Gastrointestinal: Soft and nontender. No distention.  No CVA tenderness.  Musculoskeletal: No lower extremity tenderness nor edema.  Warm and well perfused Neurologic:  Normal speech and language. No gross focal neurologic deficits are appreciated.  Skin:  Skin is warm, dry and intact. No rash noted. Psychiatric: Mood and affect are normal. Speech and behavior are normal.  ____________________________________________   LABS (all labs  ordered are listed, but only abnormal results are displayed)  Labs Reviewed  COMPREHENSIVE METABOLIC PANEL - Abnormal; Notable for the following components:      Result Value   Potassium 3.3 (*)    Glucose, Bld 126 (*)    Total Protein 9.2 (*)    All other components within normal limits  CBC - Abnormal; Notable for the following components:   WBC 13.1 (*)    All other components within normal limits  LIPASE, BLOOD   ____________________________________________  EKG  ED  ECG REPORT I, Lavonia Drafts, the attending physician, personally viewed and interpreted this ECG.  Date: 04/05/2019  Rhythm: normal sinus rhythm QRS Axis: normal Intervals: normal ST/T Wave abnormalities: normal Narrative Interpretation: no evidence of acute ischemia  ____________________________________________  RADIOLOGY  None ____________________________________________   PROCEDURES  Procedure(s) performed: No  Procedures   Critical Care performed: No ____________________________________________   INITIAL IMPRESSION / ASSESSMENT AND PLAN / ED COURSE  Pertinent labs & imaging results that were available during my care of the patient were reviewed by me and considered in my medical decision making (see chart for details).  Patient well-appearing in no acute distress.  Abdominal exam is quite reassuring.  No evidence of significant dehydration.  Symptoms are most consistent with viral gastroenteritis, explained to him that this may take several days to improve.  Recommend taking Zofran as prescribed can take 2 tablets for severe nausea, Tylenol for myalgias    ____________________________________________   FINAL CLINICAL IMPRESSION(S) / ED DIAGNOSES  Final diagnoses:  Gastroenteritis        Note:  This document was prepared using Dragon voice recognition software and may include unintentional dictation errors.   Lavonia Drafts, MD 04/05/19 1346

## 2019-04-05 NOTE — ED Triage Notes (Signed)
Says pain all over, including head.  Vomiting. No diarrhea.  Fever.  Seen for same yesterday.  Says he is worse

## 2019-11-22 ENCOUNTER — Observation Stay
Admission: EM | Admit: 2019-11-22 | Discharge: 2019-11-24 | Disposition: A | Discharge status: Home / Self Care | Payer: Self-pay | Attending: Internal Medicine | Admitting: Internal Medicine

## 2019-11-22 ENCOUNTER — Emergency Department: Payer: Self-pay

## 2019-11-22 ENCOUNTER — Other Ambulatory Visit: Payer: Self-pay

## 2019-11-22 DIAGNOSIS — N179 Acute kidney failure, unspecified: Secondary | ICD-10-CM | POA: Insufficient documentation

## 2019-11-22 DIAGNOSIS — E860 Dehydration: Secondary | ICD-10-CM | POA: Insufficient documentation

## 2019-11-22 DIAGNOSIS — Z6832 Body mass index (BMI) 32.0-32.9, adult: Secondary | ICD-10-CM | POA: Insufficient documentation

## 2019-11-22 DIAGNOSIS — Z20822 Contact with and (suspected) exposure to covid-19: Secondary | ICD-10-CM | POA: Insufficient documentation

## 2019-11-22 DIAGNOSIS — F1910 Other psychoactive substance abuse, uncomplicated: Secondary | ICD-10-CM

## 2019-11-22 DIAGNOSIS — R7989 Other specified abnormal findings of blood chemistry: Secondary | ICD-10-CM | POA: Insufficient documentation

## 2019-11-22 DIAGNOSIS — F1210 Cannabis abuse, uncomplicated: Secondary | ICD-10-CM | POA: Insufficient documentation

## 2019-11-22 DIAGNOSIS — I10 Essential (primary) hypertension: Secondary | ICD-10-CM | POA: Insufficient documentation

## 2019-11-22 DIAGNOSIS — I160 Hypertensive urgency: Secondary | ICD-10-CM | POA: Insufficient documentation

## 2019-11-22 DIAGNOSIS — F17200 Nicotine dependence, unspecified, uncomplicated: Secondary | ICD-10-CM | POA: Insufficient documentation

## 2019-11-22 DIAGNOSIS — Z79899 Other long term (current) drug therapy: Secondary | ICD-10-CM | POA: Insufficient documentation

## 2019-11-22 DIAGNOSIS — R55 Syncope and collapse: Principal | ICD-10-CM | POA: Insufficient documentation

## 2019-11-22 DIAGNOSIS — Z9119 Patient's noncompliance with other medical treatment and regimen: Secondary | ICD-10-CM | POA: Insufficient documentation

## 2019-11-22 DIAGNOSIS — F1490 Cocaine use, unspecified, uncomplicated: Secondary | ICD-10-CM | POA: Diagnosis present

## 2019-11-22 LAB — CBC
HCT: 42.9 % (ref 39.0–52.0)
HCT: 45.7 % (ref 39.0–52.0)
Hemoglobin: 14.6 g/dL (ref 13.0–17.0)
Hemoglobin: 15.2 g/dL (ref 13.0–17.0)
MCH: 31.9 pg (ref 26.0–34.0)
MCH: 31.7 pg (ref 26.0–34.0)
MCHC: 34 g/dL (ref 30.0–36.0)
MCHC: 33.3 g/dL (ref 30.0–36.0)
MCV: 93.9 fL (ref 80.0–100.0)
MCV: 95.2 fL (ref 80.0–100.0)
Platelets: 264 10*3/uL (ref 150–400)
Platelets: 271 10*3/uL (ref 150–400)
RBC: 4.57 MIL/uL (ref 4.22–5.81)
RBC: 4.8 MIL/uL (ref 4.22–5.81)
RDW: 14.4 % (ref 11.5–15.5)
RDW: 14.4 % (ref 11.5–15.5)
WBC: 4.4 10*3/uL (ref 4.0–10.5)
WBC: 8.2 10*3/uL (ref 4.0–10.5)
nRBC: 0 % (ref 0.0–0.2)
nRBC: 0 % (ref 0.0–0.2)

## 2019-11-22 LAB — URINALYSIS, COMPLETE (UACMP) WITH MICROSCOPIC
Bacteria, UA: NONE SEEN
Bilirubin Urine: NEGATIVE
Glucose, UA: NEGATIVE mg/dL
Ketones, ur: NEGATIVE mg/dL
Leukocytes,Ua: NEGATIVE
Nitrite: NEGATIVE
Protein, ur: NEGATIVE mg/dL
Specific Gravity, Urine: 1.009 (ref 1.005–1.030)
pH: 6 (ref 5.0–8.0)

## 2019-11-22 LAB — URINE DRUG SCREEN, QUALITATIVE (ARMC ONLY)
Amphetamines, Ur Screen: NOT DETECTED
Barbiturates, Ur Screen: NOT DETECTED
Benzodiazepine, Ur Scrn: NOT DETECTED
Cannabinoid 50 Ng, Ur ~~LOC~~: POSITIVE — AB
Cocaine Metabolite,Ur ~~LOC~~: POSITIVE — AB
MDMA (Ecstasy)Ur Screen: NOT DETECTED
Methadone Scn, Ur: NOT DETECTED
Opiate, Ur Screen: NOT DETECTED
Phencyclidine (PCP) Ur S: NOT DETECTED
Tricyclic, Ur Screen: NOT DETECTED

## 2019-11-22 LAB — BASIC METABOLIC PANEL
Anion gap: 8 (ref 5–15)
BUN: 15 mg/dL (ref 6–20)
CO2: 26 mmol/L (ref 22–32)
Calcium: 9.2 mg/dL (ref 8.9–10.3)
Chloride: 106 mmol/L (ref 98–111)
Creatinine, Ser: 1.89 mg/dL — ABNORMAL HIGH (ref 0.61–1.24)
GFR calc Af Amer: 51 mL/min — ABNORMAL LOW (ref >60)
GFR calc non Af Amer: 44 mL/min — ABNORMAL LOW (ref >60)
Glucose, Bld: 102 mg/dL — ABNORMAL HIGH (ref 70–99)
Potassium: 4 mmol/L (ref 3.5–5.1)
Sodium: 140 mmol/L (ref 135–145)

## 2019-11-22 LAB — RESPIRATORY PANEL BY RT PCR (FLU A&B, COVID)
Influenza A by PCR: NEGATIVE
Influenza B by PCR: NEGATIVE
SARS Coronavirus 2 by RT PCR: NEGATIVE

## 2019-11-22 LAB — CREATININE, SERUM
Creatinine, Ser: 1.63 mg/dL — ABNORMAL HIGH (ref 0.61–1.24)
GFR calc Af Amer: >60 mL/min (ref >60)
GFR calc non Af Amer: 52 mL/min — ABNORMAL LOW (ref >60)

## 2019-11-22 LAB — ETHANOL: Alcohol, Ethyl (B): <10 mg/dL (ref <10)

## 2019-11-22 LAB — HEPATIC FUNCTION PANEL
ALT: 21 U/L (ref 0–44)
AST: 29 U/L (ref 15–41)
Albumin: 4.1 g/dL (ref 3.5–5.0)
Alkaline Phosphatase: 54 U/L (ref 38–126)
Bilirubin, Direct: 0.2 mg/dL (ref 0.0–0.2)
Indirect Bilirubin: 0.9 mg/dL (ref 0.3–0.9)
Total Bilirubin: 1.1 mg/dL (ref 0.3–1.2)
Total Protein: 7.7 g/dL (ref 6.5–8.1)

## 2019-11-22 LAB — CK
Total CK: 175 U/L (ref 49–397)
Total CK: 162 U/L (ref 49–397)

## 2019-11-22 LAB — TROPONIN I (HIGH SENSITIVITY)
Troponin I (High Sensitivity): 31 ng/L — ABNORMAL HIGH (ref <18)
Troponin I (High Sensitivity): 20 ng/L — ABNORMAL HIGH (ref <18)
Troponin I (High Sensitivity): 26 ng/L — ABNORMAL HIGH (ref <18)

## 2019-11-22 MED ORDER — NICOTINE 14 MG/24HR TD PT24
14.0000 mg | MEDICATED_PATCH | Freq: Every day | TRANSDERMAL | Status: DC
  Administered 2019-11-23 – 2019-11-24 (×2): 14 mg via TRANSDERMAL
  Filled 2019-11-22 (×2): qty 1

## 2019-11-22 MED ORDER — AMLODIPINE BESYLATE 10 MG PO TABS
10.0000 mg | ORAL_TABLET | Freq: Every day | ORAL | Status: DC
  Administered 2019-11-22 – 2019-11-24 (×3): 10 mg via ORAL
  Filled 2019-11-22: qty 1
  Filled 2019-11-22: qty 2
  Filled 2019-11-22: qty 1

## 2019-11-22 MED ORDER — SODIUM CHLORIDE 0.9 % IV SOLN: INTRAVENOUS | Status: DC

## 2019-11-22 MED ORDER — SODIUM CHLORIDE 0.9 % IV BOLUS
1000.0000 mL | Freq: Once | INTRAVENOUS | Status: AC
  Administered 2019-11-22: 17:00:00 1000 mL via INTRAVENOUS

## 2019-11-22 MED ORDER — ENOXAPARIN SODIUM 40 MG/0.4ML ~~LOC~~ SOLN
40.0000 mg | SUBCUTANEOUS | Status: DC
  Administered 2019-11-23 – 2019-11-24 (×2): 40 mg via SUBCUTANEOUS
  Filled 2019-11-22 (×2): qty 0.4

## 2019-11-22 MED ORDER — ACETAMINOPHEN 325 MG PO TABS
650.0000 mg | ORAL_TABLET | Freq: Four times a day (QID) | ORAL | Status: DC | PRN
  Administered 2019-11-22: 650 mg via ORAL
  Filled 2019-11-22: qty 2

## 2019-11-22 MED ORDER — LABETALOL HCL 5 MG/ML IV SOLN
10.0000 mg | INTRAVENOUS | Status: DC | PRN
  Administered 2019-11-22 – 2019-11-24 (×4): 10 mg via INTRAVENOUS
  Filled 2019-11-22 (×5): qty 4

## 2019-11-22 MED ORDER — SODIUM CHLORIDE 0.9% FLUSH: 3.0000 mL | Freq: Once | INTRAVENOUS | Status: DC

## 2019-11-22 NOTE — ED Notes (Signed)
Pt's wife calling for update on pt. Will have pt call wife on cellphone.

## 2019-11-22 NOTE — ED Provider Notes (Signed)
[facility] Emergency Department Provider Note  ____________________________________________   First MD Initiated Contact with Patient 11/22/19 1613     (approximate)  I have reviewed the triage vital signs and the nursing notes.   HISTORY  Chief Complaint Near Syncope    HPI Khanh Tanori is a 39 y.o. male who is reportedly otherwise healthy who comes in for near syncopal episode.  According to the triage note patient was at a park playing basketball when he sat down and then stood up and reportedly had near syncope.  Initially patient was hypotensive 90/55 but corrected to 133/70 5:02 100 cc of normal saline.  Patient had normal glucose.  Upon my assessment patient is very drowsy.  Patient's phone starts ringing.  I asked patient to please silence his phone so we can continue our discussion.  Patient proceeded to the answer the phone.  I waited approximately 30 seconds as patient if we can continue our conversation and patient continued to speak on the phone for over a minute.  I discussed with patient that I would go see another patient come back and evaluate him.  Upon reevaluation patient stating that he is just sleepy because he has a history of sleep apnea and this is how he always is.  He does endorse drinking 1 beer earlier this afternoon.  He denies any drugs.  He really is unable to get able to give me a story of what happened but when prompted he does say that he was playing basketball and that he almost passed out.  He denies any chest pain.  Reports some shortness of breath earlier to the nurse but really not having shortness of breath with me.  No abdominal tenderness.          History reviewed. No pertinent past medical history.  There are no problems to display for this patient.   History reviewed. No pertinent surgical history.  Prior to Admission medications   Medication Sig Start Date End Date Taking? Authorizing Provider  amLODipine  (NORVASC) 10 MG tablet Take 1 tablet (10 mg total) by mouth daily. 04/04/19 06/03/19  Minna Antis, MD  loperamide (IMODIUM A-D) 2 MG tablet Take 1 tablet (2 mg total) by mouth 4 (four) times daily as needed for diarrhea or loose stools. 04/04/19   Minna Antis, MD  ondansetron (ZOFRAN ODT) 4 MG disintegrating tablet Take 1 tablet (4 mg total) by mouth every 8 (eight) hours as needed for nausea or vomiting. 04/04/19   Minna Antis, MD    Allergies Patient has no known allergies.  No family history on file.  Social History Social History   Tobacco Use  . Smoking status: Current Every Day Smoker  . Smokeless tobacco: Never Used  Substance Use Topics  . Alcohol use: No  . Drug use: No      Review of Systems Constitutional: No fever/chills concern for near syncope Eyes: No visual changes. ENT: No sore throat. Cardiovascular: Denies chest pain. Respiratory maybe some shortness of breath Gastrointestinal: No abdominal pain.  No nausea, no vomiting.  No diarrhea.  No constipation. Genitourinary: Negative for dysuria. Musculoskeletal: Negative for back pain. Skin: Negative for rash. Neurological: Negative for headaches, focal weakness or numbness. All other ROS negative ____________________________________________   PHYSICAL EXAM:  VITAL SIGNS: ED Triage Vitals [11/22/19 1552]  Enc Vitals Group     BP 98/87     Pulse Rate 85     Resp 18     Temp 98  F (36.7 C)     Temp src      SpO2 100 %     Weight 230 lb (104.3 kg)     Height 5\' 11"  (1.803 m)     Head Circumference      Peak Flow      Pain Score 0     Pain Loc      Pain Edu?      Excl. in GC?     Constitutional: Patient is drowsy and intermittently falling asleep but awakens to tactile stimulation Eyes: Conjunctivae are normal. EOMI. Head: Atraumatic. Nose: No congestion/rhinnorhea. Mouth/Throat: Mucous membranes are moist.   Neck: No stridor. Trachea Midline. FROM Cardiovascular: Normal rate,  regular rhythm. Grossly normal heart sounds.  Good peripheral circulation. Respiratory: Normal respiratory effort.  No retractions. Lungs CTAB. Gastrointestinal: Soft and nontender. No distention. No abdominal bruits.  Musculoskeletal: No lower extremity tenderness nor edema.  No joint effusions. Neurologic:  Normal speech and language. No gross focal neurologic deficits are appreciated.  Skin:  Skin is warm, dry and intact. No rash noted. Psychiatric: Unable to fully assess due to patient's mental status GU: Deferred   ____________________________________________   LABS (all labs ordered are listed, but only abnormal results are displayed)  Labs Reviewed  BASIC METABOLIC PANEL - Abnormal; Notable for the following components:      Result Value   Glucose, Bld 102 (*)    Creatinine, Ser 1.89 (*)    GFR calc non Af Amer 44 (*)    GFR calc Af Amer 51 (*)    All other components within normal limits  TROPONIN I (HIGH SENSITIVITY) - Abnormal; Notable for the following components:   Troponin I (High Sensitivity) 31 (*)    All other components within normal limits  CBC  CK  ETHANOL  HEPATIC FUNCTION PANEL  URINALYSIS, COMPLETE (UACMP) WITH MICROSCOPIC  URINE DRUG SCREEN, QUALITATIVE ([facility] ONLY)  CBG MONITORING, ED   ____________________________________________   ED ECG REPORT I, , the attending physician, personally viewed and interpreted this ECG.  EKG is normal sinus rhythm 93, no ST elevations, T wave inversion in V6 normal intervals.  Prior EKG looks similar. ____________________________________________  RADIOLOGY   Official radiology report(s): CT Head Wo Contrast  Result Date: 11/22/2019 CLINICAL DATA:  Near syncope. EXAM: CT HEAD WITHOUT CONTRAST TECHNIQUE: Contiguous axial images were obtained from the base of the skull through the vertex without intravenous contrast. COMPARISON:  None. FINDINGS: Brain: No intracranial hemorrhage, mass effect, or midline  shift. No hydrocephalus. The basilar cisterns are patent. No evidence of territorial infarct or acute ischemia. No extra-axial or intracranial fluid collection. Vascular: No hyperdense vessel or unexpected calcification. Skull: No fracture or focal lesion. Sinuses/Orbits: Paranasal sinuses and mastoid air cells are clear. The visualized orbits are unremarkable. Other: None. IMPRESSION: Negative noncontrast head CT. Electronically Signed   By: 11/24/2019 M.D.   On: 11/22/2019 17:33   CT Cervical Spine Wo Contrast  Result Date: 11/22/2019 CLINICAL DATA:  Near syncope, fell, uncomplicated neck trauma EXAM: CT CERVICAL SPINE WITHOUT CONTRAST TECHNIQUE: Multidetector CT imaging of the cervical spine was performed without intravenous contrast. Multiplanar CT image reconstructions were also generated. COMPARISON:  None FINDINGS: Alignment: Normal Skull base and vertebrae: Osseous mineralization normal. Scattered endplate spur formation. Vertebral body heights maintained. No fracture, subluxation or bone destruction. Skull base intact. Soft tissues and spinal canal: Prevertebral soft tissues normal thickness. Disc levels: Within limitations of beam hardening related to the patient's  shoulders, no specific abnormalities Upper chest: Lung apices clear Other: Small LEFT mastoid effusion. IMPRESSION: Degenerative disc disease changes cervical spine. No acute cervical spine abnormalities. Small effusion LEFT mastoid air cells. Electronically Signed   By: Ulyses Southward M.D.   On: 11/22/2019 17:33   DG Chest Portable 1 View  Result Date: 11/22/2019 CLINICAL DATA:  Shortness of breath, syncopal episode, history sleep apnea, smoking EXAM: PORTABLE CHEST 1 VIEW COMPARISON:  Portable exam 1650 hours compared to 07/13/2017 FINDINGS: Enlargement of cardiac silhouette. Mediastinal contours and pulmonary vascularity otherwise normal for technique. Decreased lung volumes versus prior study with RIGHT basilar atelectasis.  Remaining lungs demonstrate mild crowding of markings but otherwise clear. No definite infiltrate, pleural effusion or pneumothorax. IMPRESSION: Enlargement of cardiac silhouette. Low lung volumes with RIGHT basilar atelectasis. Electronically Signed   By: Ulyses Southward M.D.   On: 11/22/2019 17:11    ____________________________________________   PROCEDURES  Procedure(s) performed (including Critical Care):  Procedures   ____________________________________________   INITIAL IMPRESSION / ASSESSMENT AND PLAN / ED COURSE  Aland Chestnutt was evaluated in Emergency Department on 11/22/2019 for the symptoms described in the history of present illness. He was evaluated in the context of the global COVID-19 pandemic, which necessitated consideration that the patient might be at risk for infection with the SARS-CoV-2 virus that causes COVID-19. Institutional protocols and algorithms that pertain to the evaluation of patients at risk for COVID-19 are in a state of rapid change based on information released by regulatory bodies including the CDC and federal and state organizations. These policies and algorithms were followed during the patient's care in the ED.    Patient is a 39 year old who comes in with report of near syncope although not really able to get much of the history from patient himself.  Will get labs to evaluate for electrolyte abnormalities, AKI.  Given unclear if patient hit his head or has any headaches will get CT head to make sure evidence of intracranial hemorrhage, CT cervical to make sure no signs of cervical fracture chest x-ray to make sure no evidence of pneumonia, pneumothorax.  Given patient was initially hypotensive I suspect that this could be secondary to dehydration and will give a liter of fluid.  Possible substance abuse given patient does report drinking some alcohol.  Patient's labs are notable for AKI.  Patient is getting fluid currently.  CK was negative therefore  unlikely to be rhabdo  Troponin was 31.  This could be elevated secondary his AKI.  Will have to get repeat.  His chest x-ray showed no signs of pneumonia does show slightly large heart.  Given his syncope will patient follow-up with cardiology may need echocardiogram.  CT head and neck were negative.  Patient's wife stated that she was with him when these episodes were happening that he did lose consciousness.  She denies having this previously.  She states that he still does not seem to be at his baseline.  Patient was okay with me sharing his UDS results.  I discussed with wife that he was THC and cocaine positive.  Patient is denying using these recently.  I discussed that he could have had some cocaine on him that made him dehydrated and that plus being outside could have caused his kidney function to go up and cause him to pass out.  She is concerned that he still not acting his normal self and that he is denied using any of these recently.  We discussed admission for syncopal  work-up including echocardiogram given his chest x-ray does show some cardiomegaly as well as monitoring his kidney dysfunction.  Family would prefer admission at this time.  Will discuss with the hospital team for admission         ____________________________________________   FINAL CLINICAL IMPRESSION(S) / ED DIAGNOSES   Final diagnoses:  AKI (acute kidney injury) ([place])  Syncope and collapse  Substance abuse ([place])      MEDICATIONS GIVEN DURING THIS VISIT:  Medications  sodium chloride flush (NS) 0.9 % injection 3 mL (3 mLs Intravenous Not Given 11/22/19 1613)  sodium chloride 0.9 % bolus 1,000 mL (0 mLs Intravenous Stopped 11/22/19 1939)     ED Discharge Orders    None       Note:  This document was prepared using Dragon voice recognition software and may include unintentional dictation errors.   Vanessa Bennington, MD 11/22/19 2006

## 2019-11-22 NOTE — ED Notes (Signed)
PT taken to CT.

## 2019-11-22 NOTE — ED Notes (Signed)
Messaged MD regarding BP. Informed that pt missed amlodipine this morning and okay with giving missing dose here. No further orders.

## 2019-11-22 NOTE — ED Notes (Signed)
Pt states that he's been feeling very weak. Pt denies nvd but states he has been feeling a little shob. Pt only responding by shaking head to this RN's questions.

## 2019-11-22 NOTE — ED Triage Notes (Signed)
First RN Note: Pt presents to ED via [facility] with c/o near syncope. Pt was at park playing basketball and having barbeque, per EMS pt finished, sat down and when he stood up family reports near syncopal episode. Per EMS initial BP 90/55.   90/55 -> 133/72 after 500cc NS 73NSR CBG 89   Hx of sleep apnea and "undiagnosed narcolepsy".

## 2019-11-22 NOTE — H&P (Signed)
PCP:   Patient, [name]   Chief Complaint:    HPI: This is a 39 year old male who presents after having repeated syncopal episodes.  Per patient he was playing basketball around noon.  He states was hot outside.  He said he drank a beer and then started getting dizzy.  He states he was staggering a bit and he was sweating.  He states he did not pass out however he does close his eyes.  Per patient's wife and bystanders the patient repeated episodes of loss of consciousness.  In the ER he was quite groggy.  He does not remember much of the conversation that occurred between the ER physician and his wife.  The patient denies being ill prior to this.  He denies any chest pains.  He does report having a history of hypertension for years for which he has never sought treatment.  He does not check his blood pressure at home.  In the ER his UDS was positive for cocaine and marijuana.  His troponin was elevated but downtrending.  Per his wife he has not used cocaine for few days.  The patient does confirm this during my interview.  During my interview he was much more awake and alert than prior.  Most history obtained from the patient.  But additionally from the ER physician and chart review.  Review of Systems:  The patient denies anorexia, fever, weight loss,, vision loss, decreased hearing, hoarseness, chest pain, syncope, dyspnea on exertion, peripheral edema, balance deficits, hemoptysis, abdominal pain, melena, hematochezia, severe indigestion/heartburn, hematuria, incontinence, genital sores, muscle weakness, suspicious skin lesions, transient blindness, difficulty walking, depression, unusual weight change, abnormal bleeding, enlarged lymph nodes, angioedema, and breast masses.  Positive for syncope and collapse.  Negative for chest pains  Past Medical History: History reviewed. No pertinent past medical history. History reviewed. No pertinent surgical history.  Medications: Prior to  Admission medications   Medication Sig Start Date End Date Taking? Authorizing Provider  amLODipine (NORVASC) 10 MG tablet Take 1 tablet (10 mg total) by mouth daily. 04/04/19 06/03/19  Harvest Dark, MD  loperamide (IMODIUM A-D) 2 MG tablet Take 1 tablet (2 mg total) by mouth 4 (four) times daily as needed for diarrhea or loose stools. 04/04/19   Harvest Dark, MD  ondansetron (ZOFRAN ODT) 4 MG disintegrating tablet Take 1 tablet (4 mg total) by mouth every 8 (eight) hours as needed for nausea or vomiting. 04/04/19   Harvest Dark, MD    Allergies:  No Known Allergies  Social History:  reports that he has been smoking. He has never used smokeless tobacco.  Positive cocaine and marijuana.   Family History: HTN  Physical Exam: Vitals:   11/22/19 1552 11/22/19 1614 11/22/19 1630  BP: 98/87 127/72 (!) 145/80  Pulse: 85 84 80  Resp: 18 16 18   Temp: 98 F (36.7 C)    SpO2: 100% 97% 96%  Weight: 104.3 kg    Height: 5\' 11"  (1.803 m)      General:  Alert and oriented times three, well developed and nourished, no acute distress Eyes: PERRLA, pink conjunctiva, no scleral icterus ENT: Moist oral mucosa, neck supple, no thyromegaly Lungs: clear to ascultation, no wheeze, no crackles, no use of accessory muscles Cardiovascular: regular rate and rhythm, no regurgitation, no gallops, no murmurs. No carotid bruits, no JVD Abdomen: soft, positive BS, non-tender, non-distended, no organomegaly, not an acute abdomen GU: not examined Neuro: CN II - XII grossly intact, sensation intact Musculoskeletal: strength  5/5 all extremities, no clubbing, cyanosis or edema Skin: no rash, no subcutaneous crepitation, no decubitus Psych: appropriate patient   Labs on Admission:  Recent Labs    11/22/19 1553  NA 140  K 4.0  CL 106  CO2 26  GLUCOSE 102*  BUN 15  CREATININE 1.89*  CALCIUM 9.2   Recent Labs    11/22/19 1657  AST 29  ALT 21  ALKPHOS 54  BILITOT 1.1  PROT 7.7  ALBUMIN  4.1   No results for input(s): LIPASE, AMYLASE in the last 72 hours. Recent Labs    11/22/19 1553  WBC 4.4  HGB 14.6  HCT 42.9  MCV 93.9  PLT 264   Recent Labs    11/22/19 1553  CKTOTAL 175   Invalid input(s): POCBNP No results for input(s): DDIMER in the last 72 hours. No results for input(s): HGBA1C in the last 72 hours. No results for input(s): CHOL, HDL, LDLCALC, TRIG, CHOLHDL, LDLDIRECT in the last 72 hours. No results for input(s): TSH, T4TOTAL, T3FREE, THYROIDAB in the last 72 hours.  Invalid input(s): FREET3 No results for input(s): VITAMINB12, FOLATE, FERRITIN, TIBC, IRON, RETICCTPCT in the last 72 hours.  Micro Results: No results found for this or any previous visit (from the past 240 hour(s)).   Radiological Exams on Admission: CT Head Wo Contrast  Result Date: 11/22/2019 CLINICAL DATA:  Near syncope. EXAM: CT HEAD WITHOUT CONTRAST TECHNIQUE: Contiguous axial images were obtained from the base of the skull through the vertex without intravenous contrast. COMPARISON:  None. FINDINGS: Brain: No intracranial hemorrhage, mass effect, or midline shift. No hydrocephalus. The basilar cisterns are patent. No evidence of territorial infarct or acute ischemia. No extra-axial or intracranial fluid collection. Vascular: No hyperdense vessel or unexpected calcification. Skull: No fracture or focal lesion. Sinuses/Orbits: Paranasal sinuses and mastoid air cells are clear. The visualized orbits are unremarkable. Other: None. IMPRESSION: Negative noncontrast head CT. Electronically Signed   By: Narda Rutherford M.D.   On: 11/22/2019 17:33   CT Cervical Spine Wo Contrast  Result Date: 11/22/2019 CLINICAL DATA:  Near syncope, fell, uncomplicated neck trauma EXAM: CT CERVICAL SPINE WITHOUT CONTRAST TECHNIQUE: Multidetector CT imaging of the cervical spine was performed without intravenous contrast. Multiplanar CT image reconstructions were also generated. COMPARISON:  None FINDINGS:  Alignment: Normal Skull base and vertebrae: Osseous mineralization normal. Scattered endplate spur formation. Vertebral body heights maintained. No fracture, subluxation or bone destruction. Skull base intact. Soft tissues and spinal canal: Prevertebral soft tissues normal thickness. Disc levels: Within limitations of beam hardening related to the patient's shoulders, no specific abnormalities Upper chest: Lung apices clear Other: Small LEFT mastoid effusion. IMPRESSION: Degenerative disc disease changes cervical spine. No acute cervical spine abnormalities. Small effusion LEFT mastoid air cells. Electronically Signed   By: Ulyses Southward M.D.   On: 11/22/2019 17:33   DG Chest Portable 1 View  Result Date: 11/22/2019 CLINICAL DATA:  Shortness of breath, syncopal episode, history sleep apnea, smoking EXAM: PORTABLE CHEST 1 VIEW COMPARISON:  Portable exam 1650 hours compared to 07/13/2017 FINDINGS: Enlargement of cardiac silhouette. Mediastinal contours and pulmonary vascularity otherwise normal for technique. Decreased lung volumes versus prior study with RIGHT basilar atelectasis. Remaining lungs demonstrate mild crowding of markings but otherwise clear. No definite infiltrate, pleural effusion or pneumothorax. IMPRESSION: Enlargement of cardiac silhouette. Low lung volumes with RIGHT basilar atelectasis. Electronically Signed   By: Ulyses Southward M.D.   On: 11/22/2019 17:11    Assessment/Plan Present on Admission: .  Syncope and collapse -Bring in for overnight observation med telemetry -Suspect likely due to dehydration/heat exhaustion/cocaine use -We will rule out rhabdo, CK ordered -We will continue to cycle cardiac enzymes, monitoring telemetry, IV fluids and echo in a.m.  Marland Kitchen Cocaine use . Marijuana abuse -Aware  . AKI (acute kidney injury) (HCC)/dehydration -IV fluid hydration -BMP in a.m.  Elevated troponin -Doubt related to NSTEMI but will cycle cardiac enzymes.  No complaints of chest pain,  elevated creatinine.  Troponin already trending down -Chest x-ray suspicious for cardiomegaly, 2D echo ordered for the a.m. -If cardiomegaly confirmed on echo likely due to combination of untreated hypertension and cocaine use -We will add a lipid panel  HTN -not on medications. Diagnosed for years. Does not check his BP at home -Home meds of Norvasc resumed  Tobacco use -Nicotine patch ordered  Andrew Walters 11/22/2019, 7:30 PM

## 2019-11-22 NOTE — ED Triage Notes (Addendum)
Pt here via [facility] with c/o near syncopal episode. Pt falling asleep in triage and not answering questions.  Pt states he has sleep apnea.  Pt states his wife called EMS. Pt denies any pain. Pt denies any SOB or CP. Pt states some dizziness.  Pt appears to have some labored belly breathing present.

## 2019-11-22 NOTE — ED Notes (Signed)
Messaged MD regarding pt having headache. Awaiting orders.

## 2019-11-22 NOTE — ED Notes (Signed)
Sent message to pharmacy requesting verification of medications

## 2019-11-23 ENCOUNTER — Observation Stay
Admit: 2019-11-23 | Discharge: 2019-11-23 | Disposition: A | Discharge status: Home / Self Care | Payer: Self-pay | Attending: Family Medicine | Admitting: Family Medicine

## 2019-11-23 DIAGNOSIS — N179 Acute kidney failure, unspecified: Secondary | ICD-10-CM

## 2019-11-23 DIAGNOSIS — F1490 Cocaine use, unspecified, uncomplicated: Secondary | ICD-10-CM

## 2019-11-23 DIAGNOSIS — I10 Essential (primary) hypertension: Secondary | ICD-10-CM

## 2019-11-23 DIAGNOSIS — R55 Syncope and collapse: Secondary | ICD-10-CM

## 2019-11-23 LAB — COMPREHENSIVE METABOLIC PANEL
ALT: 17 U/L (ref 0–44)
AST: 19 U/L (ref 15–41)
Albumin: 4 g/dL (ref 3.5–5.0)
Alkaline Phosphatase: 54 U/L (ref 38–126)
Anion gap: 7 (ref 5–15)
BUN: 15 mg/dL (ref 6–20)
CO2: 29 mmol/L (ref 22–32)
Calcium: 9.3 mg/dL (ref 8.9–10.3)
Chloride: 104 mmol/L (ref 98–111)
Creatinine, Ser: 1.31 mg/dL — ABNORMAL HIGH (ref 0.61–1.24)
GFR calc Af Amer: >60 mL/min (ref >60)
GFR calc non Af Amer: >60 mL/min (ref >60)
Glucose, Bld: 138 mg/dL — ABNORMAL HIGH (ref 70–99)
Potassium: 3.6 mmol/L (ref 3.5–5.1)
Sodium: 140 mmol/L (ref 135–145)
Total Bilirubin: 0.8 mg/dL (ref 0.3–1.2)
Total Protein: 7.6 g/dL (ref 6.5–8.1)

## 2019-11-23 LAB — HIV ANTIBODY (ROUTINE TESTING W REFLEX): HIV Screen 4th Generation wRfx: NONREACTIVE

## 2019-11-23 LAB — CBC
HCT: 44.5 % (ref 39.0–52.0)
Hemoglobin: 14.9 g/dL (ref 13.0–17.0)
MCH: 31.8 pg (ref 26.0–34.0)
MCHC: 33.5 g/dL (ref 30.0–36.0)
MCV: 94.9 fL (ref 80.0–100.0)
Platelets: 269 10*3/uL (ref 150–400)
RBC: 4.69 MIL/uL (ref 4.22–5.81)
RDW: 14.4 % (ref 11.5–15.5)
WBC: 9.3 10*3/uL (ref 4.0–10.5)
nRBC: 0 % (ref 0.0–0.2)

## 2019-11-23 LAB — LIPID PANEL
Cholesterol: 220 mg/dL — ABNORMAL HIGH (ref 0–200)
HDL: 57 mg/dL (ref >40)
LDL Cholesterol: 128 mg/dL — ABNORMAL HIGH (ref 0–99)
Total CHOL/HDL Ratio: 3.9 RATIO
Triglycerides: 177 mg/dL — ABNORMAL HIGH (ref <150)
VLDL: 35 mg/dL (ref 0–40)

## 2019-11-23 LAB — ECHOCARDIOGRAM COMPLETE
Height: 71 in
Weight: 3680 oz

## 2019-11-23 LAB — MAGNESIUM: Magnesium: 2.4 mg/dL (ref 1.7–2.4)

## 2019-11-23 LAB — TROPONIN I (HIGH SENSITIVITY): Troponin I (High Sensitivity): 28 ng/L — ABNORMAL HIGH (ref <18)

## 2019-11-23 MED ORDER — LACTATED RINGERS IV SOLN: INTRAVENOUS | Status: DC

## 2019-11-23 MED ORDER — ASPIRIN EC 81 MG PO TBEC
81.0000 mg | DELAYED_RELEASE_TABLET | Freq: Every day | ORAL | Status: DC
  Administered 2019-11-23 – 2019-11-24 (×2): 81 mg via ORAL
  Filled 2019-11-23 (×2): qty 1

## 2019-11-23 NOTE — Progress Notes (Signed)
Order received from Dr Ayiku to discontinue telemetry 

## 2019-11-23 NOTE — Progress Notes (Signed)
Progress Note    Andrew BosworthRyan Walters  UJW:[id number] DOB: 1981-03-15  DOA: 11/22/2019 PCP: Patient, [name]      Brief Narrative:    Medical records reviewed and are as summarized below:      Assessment/Plan:   Principal Problem:   Syncope and collapse Active Problems:   Cocaine use   Marijuana abuse   AKI (acute kidney injury) (HCC)   HTN (hypertension)   S/p syncope and collapse: This was probably due to dehydration/heat exhaustion and cocaine use.  2D echo showed EF estimated at 45 to 50%, grade 1 diastolic dysfunction.  No significant valvular heart disease.  Acute kidney injury: Creatinine is improving.  Continue IV fluids but decrease rate from 125 to 50 cc/h.  Hypertension: Continue amlodipine.  Polysubstance abuse: Patient has been advised to avoid alcohol, cocaine and marijuana.  Body mass index is 32.08 kg/m.  (Obesity): Patient has been advised to exercise regularly and lose weight.  The importance of medical adherence and routine health maintenance with her primary care physician was reiterated.   Family Communication/Anticipated D/C date and plan/Code Status   DVT prophylaxis: Lovenox Code Status: Full code Family Communication: Plan discussed with his wife at the bedside Disposition Plan:    Status is: Observation  The patient will require care spanning > 2 midnights and should be moved to inpatient because: IV treatments appropriate due to intensity of illness or inability to take PO  Dispo: The patient is from: Home              Anticipated d/c is to: Home              Anticipated d/c date is: 1 day              Patient currently is not medically stable to d/c.           Subjective:   He complained of a headache which he attributed to high blood pressure.  No shortness of breath, chest pain, palpitations or dizziness.  Overall, he feels better.  Objective:    Vitals:   11/23/19 0500 11/23/19 0813 11/23/19 1000 11/23/19 1232    BP: 131/72 (!) 167/107 (!) 149/81 (!) 153/95  Pulse: 93  79 79  Resp: 16   18  Temp: 98.8 F (37.1 C)   (!) 97.5 F (36.4 C)  TempSrc: Oral   Oral  SpO2: 99%   99%  Weight:      Height:       No data found.   Intake/Output Summary (Last 24 hours) at 11/23/2019 1517 Last data filed at 11/23/2019 1420 Gross per 24 hour  Intake 2753.01 ml  Output 2040 ml  Net 713.01 ml   Filed Weights   11/22/19 1552  Weight: 104.3 kg    Exam:  GEN: NAD SKIN: No rash EYES: EOMI ENT: MMM CV: RRR PULM: CTA B ABD: soft, obese, NT, +BS CNS: AAO x 3, non focal EXT: No edema or tenderness   Data Reviewed:   I have personally reviewed following labs and imaging studies:  Labs: Labs show the following:   Basic Metabolic Panel: Recent Labs  Lab 11/22/19 1553 11/22/19 2218 11/23/19 0022  NA 140  --  140  K 4.0  --  3.6  CL 106  --  104  CO2 26  --  29  GLUCOSE 102*  --  138*  BUN 15  --  15  CREATININE 1.89* 1.63* 1.31*  CALCIUM  9.2  --  9.3  MG  --   --  2.4   GFR Estimated Creatinine Clearance: 93.1 mL/min (A) (by C-G formula based on SCr of 1.31 mg/dL (H)). Liver Function Tests: Recent Labs  Lab 11/22/19 1657 11/23/19 0022  AST 29 19  ALT 21 17  ALKPHOS 54 54  BILITOT 1.1 0.8  PROT 7.7 7.6  ALBUMIN 4.1 4.0   No results for input(s): LIPASE, AMYLASE in the last 168 hours. No results for input(s): AMMONIA in the last 168 hours. Coagulation profile No results for input(s): INR, PROTIME in the last 168 hours.  CBC: Recent Labs  Lab 11/22/19 1553 11/22/19 2218 11/23/19 0022  WBC 4.4 8.2 9.3  HGB 14.6 15.2 14.9  HCT 42.9 45.7 44.5  MCV 93.9 95.2 94.9  PLT 264 271 269   Cardiac Enzymes: Recent Labs  Lab 11/22/19 1553 11/22/19 2218  CKTOTAL 175 162   BNP (last 3 results) No results for input(s): PROBNP in the last 8760 hours. CBG: No results for input(s): GLUCAP in the last 168 hours. D-Dimer: No results for input(s): DDIMER in the last 72  hours. Hgb A1c: No results for input(s): HGBA1C in the last 72 hours. Lipid Profile: Recent Labs    11/23/19 0022  CHOL 220*  HDL 57  LDLCALC 128*  TRIG 177*  CHOLHDL 3.9   Thyroid function studies: No results for input(s): TSH, T4TOTAL, T3FREE, THYROIDAB in the last 72 hours.  Invalid input(s): FREET3 Anemia work up: No results for input(s): VITAMINB12, FOLATE, FERRITIN, TIBC, IRON, RETICCTPCT in the last 72 hours. Sepsis Labs: Recent Labs  Lab 11/22/19 1553 11/22/19 2218 11/23/19 0022  WBC 4.4 8.2 9.3    Microbiology Recent Results (from the past 240 hour(s))  Respiratory Panel by RT PCR (Flu A&B, Covid) - Nasopharyngeal Swab     Status: None   Collection Time: 11/22/19  7:27 PM   Specimen: Nasopharyngeal Swab  Result Value Ref Range Status   SARS Coronavirus 2 by RT PCR NEGATIVE NEGATIVE Final    Comment: (NOTE) SARS-CoV-2 target nucleic acids are NOT DETECTED. The SARS-CoV-2 RNA is generally detectable in upper respiratoy specimens during the acute phase of infection. The lowest concentration of SARS-CoV-2 viral copies this assay can detect is 131 copies/mL. A negative result does not preclude SARS-Cov-2 infection and should not be used as the sole basis for treatment or other patient management decisions. A negative result may occur with  improper specimen collection/handling, submission of specimen other than nasopharyngeal swab, presence of viral mutation(s) within the areas targeted by this assay, and inadequate number of viral copies (<131 copies/mL). A negative result must be combined with clinical observations, patient history, and epidemiological information. The expected result is Negative. Fact Sheet for Patients:  https://www.moore.com/ Fact Sheet for Healthcare Providers:  https://www.young.biz/ This test is not yet ap proved or cleared by the [place] FDA and  has been authorized for detection and/or  diagnosis of SARS-CoV-2 by FDA under an Emergency Use Authorization (EUA). This EUA will remain  in effect (meaning this test can be used) for the duration of the COVID-19 declaration under Section 564(b)(1) of the Act, 21 U.S.C. section 360bbb-3(b)(1), unless the authorization is terminated or revoked sooner.    Influenza A by PCR NEGATIVE NEGATIVE Final   Influenza B by PCR NEGATIVE NEGATIVE Final    Comment: (NOTE) The Xpert Xpress SARS-CoV-2/FLU/RSV assay is intended as an aid in  the diagnosis of influenza from Nasopharyngeal swab specimens and  should not be used as a sole basis for treatment. Nasal washings and  aspirates are unacceptable for Xpert Xpress SARS-CoV-2/FLU/RSV  testing. Fact Sheet for Patients: https://www.moore.com/ Fact Sheet for Healthcare Providers: https://www.young.biz/ This test is not yet approved or cleared by the [place] FDA and  has been authorized for detection and/or diagnosis of SARS-CoV-2 by  FDA under an Emergency Use Authorization (EUA). This EUA will remain  in effect (meaning this test can be used) for the duration of the  Covid-19 declaration under Section 564(b)(1) of the Act, 21  U.S.C. section 360bbb-3(b)(1), unless the authorization is  terminated or revoked. Performed at [facility], [address], [place], [place] [address]     Procedures and diagnostic studies:  CT Head Wo Contrast  Result Date: 11/22/2019 CLINICAL DATA:  Near syncope. EXAM: CT HEAD WITHOUT CONTRAST TECHNIQUE: Contiguous axial images were obtained from the base of the skull through the vertex without intravenous contrast. COMPARISON:  None. FINDINGS: Brain: No intracranial hemorrhage, mass effect, or midline shift. No hydrocephalus. The basilar cisterns are patent. No evidence of territorial infarct or acute ischemia. No extra-axial or intracranial fluid collection. Vascular: No hyperdense vessel or unexpected  calcification. Skull: No fracture or focal lesion. Sinuses/Orbits: Paranasal sinuses and mastoid air cells are clear. The visualized orbits are unremarkable. Other: None. IMPRESSION: Negative noncontrast head CT. Electronically Signed   By: Narda Rutherford M.D.   On: 11/22/2019 17:33   CT Cervical Spine Wo Contrast  Result Date: 11/22/2019 CLINICAL DATA:  Near syncope, fell, uncomplicated neck trauma EXAM: CT CERVICAL SPINE WITHOUT CONTRAST TECHNIQUE: Multidetector CT imaging of the cervical spine was performed without intravenous contrast. Multiplanar CT image reconstructions were also generated. COMPARISON:  None FINDINGS: Alignment: Normal Skull base and vertebrae: Osseous mineralization normal. Scattered endplate spur formation. Vertebral body heights maintained. No fracture, subluxation or bone destruction. Skull base intact. Soft tissues and spinal canal: Prevertebral soft tissues normal thickness. Disc levels: Within limitations of beam hardening related to the patient's shoulders, no specific abnormalities Upper chest: Lung apices clear Other: Small LEFT mastoid effusion. IMPRESSION: Degenerative disc disease changes cervical spine. No acute cervical spine abnormalities. Small effusion LEFT mastoid air cells. Electronically Signed   By: Ulyses Southward M.D.   On: 11/22/2019 17:33   DG Chest Portable 1 View  Result Date: 11/22/2019 CLINICAL DATA:  Shortness of breath, syncopal episode, history sleep apnea, smoking EXAM: PORTABLE CHEST 1 VIEW COMPARISON:  Portable exam 1650 hours compared to 07/13/2017 FINDINGS: Enlargement of cardiac silhouette. Mediastinal contours and pulmonary vascularity otherwise normal for technique. Decreased lung volumes versus prior study with RIGHT basilar atelectasis. Remaining lungs demonstrate mild crowding of markings but otherwise clear. No definite infiltrate, pleural effusion or pneumothorax. IMPRESSION: Enlargement of cardiac silhouette. Low lung volumes with RIGHT  basilar atelectasis. Electronically Signed   By: Ulyses Southward M.D.   On: 11/22/2019 17:11   ECHOCARDIOGRAM COMPLETE  Result Date: 11/23/2019    ECHOCARDIOGRAM REPORT   Patient Name:   Andrew Walters Date of Exam: 11/23/2019 Medical Rec #:  [id number]   Height:       71.0 in Accession #:    [id number]  Weight:       230.0 lb Date of Birth:  [birth year]   BSA:          2.238 m Patient Age:    38 years    BP:           149/81 mmHg Patient Gender: M  HR:           79 bpm. Exam Location:  [facility] Procedure: 2D Echo, Color Doppler and Cardiac Doppler Indications:     I51.7 Cardiomegaly  History:         Patient has no prior history of Echocardiogram examinations. No                  medical history.  Sonographer:     Charmayne Sheer RDCS (AE) Referring Phys:  [place] Diagnosing Phys: Neoma Laming MD IMPRESSIONS  1. Left ventricular ejection fraction, by estimation, is 45 to 50%. The left ventricle has mildly decreased function. The left ventricle demonstrates global hypokinesis. Left ventricular diastolic parameters are consistent with Grade I diastolic dysfunction (impaired relaxation).  2. Right ventricular systolic function is normal. The right ventricular size is normal.  3. Left atrial size was mildly dilated.  4. Right atrial size was mildly dilated.  5. The mitral valve is normal in structure. Trivial mitral valve regurgitation. No evidence of mitral stenosis.  6. The aortic valve is normal in structure. Aortic valve regurgitation is mild. No aortic stenosis is present.  7. The inferior vena cava is normal in size with greater than 50% respiratory variability, suggesting right atrial pressure of 3 mmHg. FINDINGS  Left Ventricle: Left ventricular ejection fraction, by estimation, is 45 to 50%. The left ventricle has mildly decreased function. The left ventricle demonstrates global hypokinesis. The left ventricular internal cavity size was normal in size. There is  no left ventricular hypertrophy. Left  ventricular diastolic parameters are consistent with Grade I diastolic dysfunction (impaired relaxation). Right Ventricle: The right ventricular size is normal. No increase in right ventricular wall thickness. Right ventricular systolic function is normal. Left Atrium: Left atrial size was mildly dilated. Right Atrium: Right atrial size was mildly dilated. Pericardium: There is no evidence of pericardial effusion. Mitral Valve: The mitral valve is normal in structure. Normal mobility of the mitral valve leaflets. Trivial mitral valve regurgitation. No evidence of mitral valve stenosis. MV peak gradient, 4.7 mmHg. The mean mitral valve gradient is 2.0 mmHg. Tricuspid Valve: The tricuspid valve is normal in structure. Tricuspid valve regurgitation is trivial. No evidence of tricuspid stenosis. Aortic Valve: The aortic valve is normal in structure. Aortic valve regurgitation is mild. No aortic stenosis is present. Aortic valve mean gradient measures 5.0 mmHg. Aortic valve peak gradient measures 9.0 mmHg. Aortic valve area, by VTI measures 2.40 cm. Pulmonic Valve: The pulmonic valve was normal in structure. Pulmonic valve regurgitation is trivial. No evidence of pulmonic stenosis. Aorta: The aortic root is normal in size and structure. Venous: The inferior vena cava is normal in size with greater than 50% respiratory variability, suggesting right atrial pressure of 3 mmHg. IAS/Shunts: No atrial level shunt detected by color flow Doppler.  LEFT VENTRICLE PLAX 2D LVIDd:         6.02 cm      Diastology LVIDs:         4.52 cm      LV e' lateral:   4.90 cm/s LV PW:         1.12 cm      LV E/e' lateral: 17.8 LV IVS:        1.13 cm      LV e' medial:    6.42 cm/s LVOT diam:     2.30 cm      LV E/e' medial:  13.6 LV SV:  71 LV SV Index:   32 LVOT Area:     4.15 cm  LV Volumes (MOD) LV vol d, MOD A2C: 144.0 ml LV vol d, MOD A4C: 161.0 ml LV vol s, MOD A2C: 72.2 ml LV vol s, MOD A4C: 77.0 ml LV SV MOD A2C:     71.8 ml LV  SV MOD A4C:     161.0 ml LV SV MOD BP:      78.9 ml RIGHT VENTRICLE RV Basal diam:  3.96 cm LEFT ATRIUM             Index       RIGHT ATRIUM           Index LA diam:        4.00 cm 1.79 cm/m  RA Area:     11.80 cm LA Vol (A2C):   43.1 ml 19.26 ml/m RA Volume:   24.10 ml  10.77 ml/m LA Vol (A4C):   60.7 ml 27.13 ml/m LA Biplane Vol: 55.1 ml 24.62 ml/m  AORTIC VALVE                    PULMONIC VALVE AV Area (Vmax):    2.60 cm     PV Vmax:       1.14 m/s AV Area (Vmean):   2.63 cm     PV Vmean:      73.100 cm/s AV Area (VTI):     2.40 cm     PV VTI:        0.195 m AV Vmax:           150.00 cm/s  PV Peak grad:  5.2 mmHg AV Vmean:          100.000 cm/s PV Mean grad:  3.0 mmHg AV VTI:            0.296 m AV Peak Grad:      9.0 mmHg AV Mean Grad:      5.0 mmHg LVOT Vmax:         93.80 cm/s LVOT Vmean:        63.300 cm/s LVOT VTI:          0.171 m LVOT/AV VTI ratio: 0.58  AORTA Ao Root diam: 3.50 cm MITRAL VALVE MV Area (PHT): 5.84 cm    SHUNTS MV Peak grad:  4.7 mmHg    Systemic VTI:  0.17 m MV Mean grad:  2.0 mmHg    Systemic Diam: 2.30 cm MV Vmax:       1.08 m/s MV Vmean:      60.0 cm/s MV Decel Time: 130 msec MV E velocity: 87.30 cm/s MV A velocity: 49.50 cm/s MV E/A ratio:  1.76 Adrian Blackwater MD Electronically signed by Adrian Blackwater MD Signature Date/Time: 11/23/2019/1:34:20 PM    Final     Medications:    amLODipine  10 mg Oral Daily   aspirin EC  81 mg Oral Daily   enoxaparin (LOVENOX) injection  40 mg Subcutaneous Q24H   nicotine  14 mg Transdermal Daily   sodium chloride flush  3 mL Intravenous Once   Continuous Infusions:  lactated ringers 50 mL/hr at 11/23/19 1437     LOS: 0 days   Andrew Walters  [facility]     11/23/2019, 3:17 PM

## 2019-11-23 NOTE — Progress Notes (Signed)
*  PRELIMINARY RESULTS* Echocardiogram 2D Echocardiogram has been performed.  Andrew Walters 11/23/2019, 11:26 AM

## 2019-11-24 DIAGNOSIS — F1210 Cannabis abuse, uncomplicated: Secondary | ICD-10-CM

## 2019-11-24 LAB — BASIC METABOLIC PANEL
Anion gap: 9 (ref 5–15)
BUN: 12 mg/dL (ref 6–20)
CO2: 28 mmol/L (ref 22–32)
Calcium: 9.3 mg/dL (ref 8.9–10.3)
Chloride: 102 mmol/L (ref 98–111)
Creatinine, Ser: 1.07 mg/dL (ref 0.61–1.24)
GFR calc Af Amer: >60 mL/min (ref >60)
GFR calc non Af Amer: >60 mL/min (ref >60)
Glucose, Bld: 111 mg/dL — ABNORMAL HIGH (ref 70–99)
Potassium: 3.7 mmol/L (ref 3.5–5.1)
Sodium: 139 mmol/L (ref 135–145)

## 2019-11-24 MED ORDER — LISINOPRIL 10 MG PO TABS: 10.0000 mg | ORAL_TABLET | Freq: Every day | ORAL | Status: DC

## 2019-11-24 MED ORDER — HYDROCHLOROTHIAZIDE 25 MG PO TABS: 25.0000 mg | ORAL_TABLET | Freq: Every day | ORAL | 0 refills | Status: DC

## 2019-11-24 MED ORDER — LISINOPRIL 10 MG PO TABS
10.0000 mg | ORAL_TABLET | Freq: Every day | ORAL | Status: DC
  Administered 2019-11-24: 10 mg via ORAL
  Filled 2019-11-24: qty 1

## 2019-11-24 MED ORDER — HYDROCHLOROTHIAZIDE 25 MG PO TABS
25.0000 mg | ORAL_TABLET | Freq: Every day | ORAL | Status: DC
  Administered 2019-11-24: 25 mg via ORAL
  Filled 2019-11-24: qty 1

## 2019-11-24 MED ORDER — OXYCODONE HCL 5 MG PO TABS
5.0000 mg | ORAL_TABLET | Freq: Once | ORAL | Status: AC
  Administered 2019-11-24: 5 mg via ORAL
  Filled 2019-11-24: qty 1

## 2019-11-24 MED ORDER — LISINOPRIL 20 MG PO TABS: 20.0000 mg | ORAL_TABLET | Freq: Every day | ORAL | 0 refills | Status: DC

## 2019-11-24 NOTE — Discharge Summary (Signed)
Physician Discharge Summary  Andrew Walters MPN:[id number] DOB: 16-Jul-1981 DOA: 11/22/2019  PCP: Patient, [name]  Admit date: 11/22/2019 Discharge date: 11/24/2019  Discharge disposition: Home   Recommendations for Outpatient Follow-Up:   Outpatient follow-up with PCP in 1 week   Discharge Diagnosis:   Principal Problem:   Syncope and collapse Active Problems:   Cocaine use   Marijuana abuse   AKI (acute kidney injury) (HCC)   HTN (hypertension)    Discharge Condition: Stable.  Diet recommendation: Low-salt diet  Code status: Full code.    Hospital Course:   Mr. Andrew Walters is a 39 year old man with medical history significant for morbid obesity, hypertension, polysubstance abuse (cocaine, marijuana, tobacco, alcohol), medical nonadherence, who presented to the hospital with a syncopal episode while playing basketball.  He said that he had drank alcohol prior to playing basketball.  Work-up revealed acute kidney injury likely from dehydration.  He had mildly elevated troponin was attributed to demand ischemia.  His blood pressure was severely elevated and he was started on antihypertensives.  He was treated with IV fluids.  He was also started on antihypertensives for hypertensive urgency.  2D echo showed EF estimated at 45 to 50% and grade 1 diastolic dysfunction.  His condition has improved and he is deemed stable for discharge to home.  The importance of medical adherence was emphasized.  Regular follow-up with PCP for routine health maintenance was strongly recommended as well.  He was counseled to avoid cocaine, marijuana, tobacco and alcohol.     Discharge Exam:   Vitals:   11/24/19 1632 11/24/19 1634  BP: (!) 165/105 (!) 161/101  Pulse: 90 91  Resp:    Temp:    SpO2:     Vitals:   11/24/19 1222 11/24/19 1224 11/24/19 1632 11/24/19 1634  BP: (!) 178/111 (!) 171/114 (!) 165/105 (!) 161/101  Pulse: 85 85 90 91  Resp: 14     Temp: 98.1 F (36.7 C)       TempSrc: Oral     SpO2: 100%     Weight:      Height:         GEN: NAD, obese SKIN: No rash EYES: EOMI ENT: MMM CV: RRR PULM: CTA B ABD: soft, obese, NT, +BS CNS: AAO x 3, non focal EXT: No edema or tenderness   The results of significant diagnostics from this hospitalization (including imaging, microbiology, ancillary and laboratory) are listed below for reference.     Procedures and Diagnostic Studies:   ECHOCARDIOGRAM COMPLETE  Result Date: 11/23/2019    ECHOCARDIOGRAM REPORT   Patient Name:   Andrew Walters Date of Exam: 11/23/2019 Medical Rec #:  [id number]   Height:       71.0 in Accession #:    [id number]  Weight:       230.0 lb Date of Birth:  [birth year]   BSA:          2.238 m Patient Age:    38 years    BP:           149/81 mmHg Patient Gender: M           HR:           79 bpm. Exam Location:  [facility] Procedure: 2D Echo, Color Doppler and Cardiac Doppler Indications:     I51.7 Cardiomegaly  History:         Patient has no prior history of Echocardiogram examinations. No  medical history.  Sonographer:     Humphrey RollsJoan Heiss RDCS (AE) Referring Phys:  [id number] DEBBY CROSLEY Diagnosing Phys: Adrian BlackwaterShaukat Khan MD IMPRESSIONS  1. Left ventricular ejection fraction, by estimation, is 45 to 50%. The left ventricle has mildly decreased function. The left ventricle demonstrates global hypokinesis. Left ventricular diastolic parameters are consistent with Grade I diastolic dysfunction (impaired relaxation).  2. Right ventricular systolic function is normal. The right ventricular size is normal.  3. Left atrial size was mildly dilated.  4. Right atrial size was mildly dilated.  5. The mitral valve is normal in structure. Trivial mitral valve regurgitation. No evidence of mitral stenosis.  6. The aortic valve is normal in structure. Aortic valve regurgitation is mild. No aortic stenosis is present.  7. The inferior vena cava is normal in size with greater than 50% respiratory variability, suggesting  right atrial pressure of 3 mmHg. FINDINGS  Left Ventricle: Left ventricular ejection fraction, by estimation, is 45 to 50%. The left ventricle has mildly decreased function. The left ventricle demonstrates global hypokinesis. The left ventricular internal cavity size was normal in size. There is  no left ventricular hypertrophy. Left ventricular diastolic parameters are consistent with Grade I diastolic dysfunction (impaired relaxation). Right Ventricle: The right ventricular size is normal. No increase in right ventricular wall thickness. Right ventricular systolic function is normal. Left Atrium: Left atrial size was mildly dilated. Right Atrium: Right atrial size was mildly dilated. Pericardium: There is no evidence of pericardial effusion. Mitral Valve: The mitral valve is normal in structure. Normal mobility of the mitral valve leaflets. Trivial mitral valve regurgitation. No evidence of mitral valve stenosis. MV peak gradient, 4.7 mmHg. The mean mitral valve gradient is 2.0 mmHg. Tricuspid Valve: The tricuspid valve is normal in structure. Tricuspid valve regurgitation is trivial. No evidence of tricuspid stenosis. Aortic Valve: The aortic valve is normal in structure. Aortic valve regurgitation is mild. No aortic stenosis is present. Aortic valve mean gradient measures 5.0 mmHg. Aortic valve peak gradient measures 9.0 mmHg. Aortic valve area, by VTI measures 2.40 cm. Pulmonic Valve: The pulmonic valve was normal in structure. Pulmonic valve regurgitation is trivial. No evidence of pulmonic stenosis. Aorta: The aortic root is normal in size and structure. Venous: The inferior vena cava is normal in size with greater than 50% respiratory variability, suggesting right atrial pressure of 3 mmHg. IAS/Shunts: No atrial level shunt detected by color flow Doppler.  LEFT VENTRICLE PLAX 2D LVIDd:         6.02 cm      Diastology LVIDs:         4.52 cm      LV e' lateral:   4.90 cm/s LV PW:         1.12 cm      LV E/e'  lateral: 17.8 LV IVS:        1.13 cm      LV e' medial:    6.42 cm/s LVOT diam:     2.30 cm      LV E/e' medial:  13.6 LV SV:         71 LV SV Index:   32 LVOT Area:     4.15 cm  LV Volumes (MOD) LV vol d, MOD A2C: 144.0 ml LV vol d, MOD A4C: 161.0 ml LV vol s, MOD A2C: 72.2 ml LV vol s, MOD A4C: 77.0 ml LV SV MOD A2C:     71.8 ml LV SV MOD A4C:     161.0  ml LV SV MOD BP:      78.9 ml RIGHT VENTRICLE RV Basal diam:  3.96 cm LEFT ATRIUM             Index       RIGHT ATRIUM           Index LA diam:        4.00 cm 1.79 cm/m  RA Area:     11.80 cm LA Vol (A2C):   43.1 ml 19.26 ml/m RA Volume:   24.10 ml  10.77 ml/m LA Vol (A4C):   60.7 ml 27.13 ml/m LA Biplane Vol: 55.1 ml 24.62 ml/m  AORTIC VALVE                    PULMONIC VALVE AV Area (Vmax):    2.60 cm     PV Vmax:       1.14 m/s AV Area (Vmean):   2.63 cm     PV Vmean:      73.100 cm/s AV Area (VTI):     2.40 cm     PV VTI:        0.195 m AV Vmax:           150.00 cm/s  PV Peak grad:  5.2 mmHg AV Vmean:          100.000 cm/s PV Mean grad:  3.0 mmHg AV VTI:            0.296 m AV Peak Grad:      9.0 mmHg AV Mean Grad:      5.0 mmHg LVOT Vmax:         93.80 cm/s LVOT Vmean:        63.300 cm/s LVOT VTI:          0.171 m LVOT/AV VTI ratio: 0.58  AORTA Ao Root diam: 3.50 cm MITRAL VALVE MV Area (PHT): 5.84 cm    SHUNTS MV Peak grad:  4.7 mmHg    Systemic VTI:  0.17 m MV Mean grad:  2.0 mmHg    Systemic Diam: 2.30 cm MV Vmax:       1.08 m/s MV Vmean:      60.0 cm/s MV Decel Time: 130 msec MV E velocity: 87.30 cm/s MV A velocity: 49.50 cm/s MV E/A ratio:  1.76 Adrian Blackwater MD Electronically signed by Adrian Blackwater MD Signature Date/Time: 11/23/2019/1:34:20 PM    Final      Labs:   Basic Metabolic Panel: Recent Labs  Lab 11/22/19 1553 11/22/19 1553 11/22/19 2218 11/23/19 0022 11/24/19 0518  NA 140  --   --  140 139  K 4.0   < >  --  3.6 3.7  CL 106  --   --  104 102  CO2 26  --   --  29 28  GLUCOSE 102*  --   --  138* 111*  BUN 15  --   --  15  12  CREATININE 1.89*  --  1.63* 1.31* 1.07  CALCIUM 9.2  --   --  9.3 9.3  MG  --   --   --  2.4  --    < > = values in this interval not displayed.   GFR Estimated Creatinine Clearance: 113.9 mL/min (by C-G formula based on SCr of 1.07 mg/dL). Liver Function Tests: Recent Labs  Lab 11/22/19 1657 11/23/19 0022  AST 29 19  ALT 21 17  ALKPHOS 54 54  BILITOT 1.1 0.8  PROT 7.7 7.6  ALBUMIN 4.1 4.0   No results for input(s): LIPASE, AMYLASE in the last 168 hours. No results for input(s): AMMONIA in the last 168 hours. Coagulation profile No results for input(s): INR, PROTIME in the last 168 hours.  CBC: Recent Labs  Lab 11/22/19 1553 11/22/19 2218 11/23/19 0022  WBC 4.4 8.2 9.3  HGB 14.6 15.2 14.9  HCT 42.9 45.7 44.5  MCV 93.9 95.2 94.9  PLT 264 271 269   Cardiac Enzymes: Recent Labs  Lab 11/22/19 1553 11/22/19 2218  CKTOTAL 175 162   BNP: Invalid input(s): POCBNP CBG: No results for input(s): GLUCAP in the last 168 hours. D-Dimer No results for input(s): DDIMER in the last 72 hours. Hgb A1c No results for input(s): HGBA1C in the last 72 hours. Lipid Profile Recent Labs    11/23/19 0022  CHOL 220*  HDL 57  LDLCALC 128*  TRIG 177*  CHOLHDL 3.9   Thyroid function studies No results for input(s): TSH, T4TOTAL, T3FREE, THYROIDAB in the last 72 hours.  Invalid input(s): FREET3 Anemia work up No results for input(s): VITAMINB12, FOLATE, FERRITIN, TIBC, IRON, RETICCTPCT in the last 72 hours. Microbiology Recent Results (from the past 240 hour(s))  Respiratory Panel by RT PCR (Flu A&B, Covid) - Nasopharyngeal Swab     Status: None   Collection Time: 11/22/19  7:27 PM   Specimen: Nasopharyngeal Swab  Result Value Ref Range Status   SARS Coronavirus 2 by RT PCR NEGATIVE NEGATIVE Final    Comment: (NOTE) SARS-CoV-2 target nucleic acids are NOT DETECTED. The SARS-CoV-2 RNA is generally detectable in upper respiratoy specimens during the acute phase of  infection. The lowest concentration of SARS-CoV-2 viral copies this assay can detect is 131 copies/mL. A negative result does not preclude SARS-Cov-2 infection and should not be used as the sole basis for treatment or other patient management decisions. A negative result may occur with  improper specimen collection/handling, submission of specimen other than nasopharyngeal swab, presence of viral mutation(s) within the areas targeted by this assay, and inadequate number of viral copies (<131 copies/mL). A negative result must be combined with clinical observations, patient history, and epidemiological information. The expected result is Negative. Fact Sheet for Patients:  https://www.moore.com/ Fact Sheet for Healthcare Providers:  https://www.young.biz/ This test is not yet ap proved or cleared by the [place] FDA and  has been authorized for detection and/or diagnosis of SARS-CoV-2 by FDA under an Emergency Use Authorization (EUA). This EUA will remain  in effect (meaning this test can be used) for the duration of the COVID-19 declaration under Section 564(b)(1) of the Act, 21 U.S.C. section 360bbb-3(b)(1), unless the authorization is terminated or revoked sooner.    Influenza A by PCR NEGATIVE NEGATIVE Final   Influenza B by PCR NEGATIVE NEGATIVE Final    Comment: (NOTE) The Xpert Xpress SARS-CoV-2/FLU/RSV assay is intended as an aid in  the diagnosis of influenza from Nasopharyngeal swab specimens and  should not be used as a sole basis for treatment. Nasal washings and  aspirates are unacceptable for Xpert Xpress SARS-CoV-2/FLU/RSV  testing. Fact Sheet for Patients: https://www.moore.com/ Fact Sheet for Healthcare Providers: https://www.young.biz/ This test is not yet approved or cleared by the [place] FDA and  has been authorized for detection and/or diagnosis of SARS-CoV-2 by  FDA under  an Emergency Use Authorization (EUA). This EUA will remain  in effect (meaning this test can be used) for the duration of the  Covid-19 declaration under Section 564(b)(1)  of the Act, 21  U.S.C. section 360bbb-3(b)(1), unless the authorization is  terminated or revoked. Performed at [facility], [address] [place], [place] [address]      Discharge Instructions:   Discharge Instructions    Diet - low sodium heart healthy   Complete by: As directed    Discharge instructions   Complete by: As directed    Avoid alcohol and cocaine.  Recommend outpatient follow-up for sleep study and CPAP   Increase activity slowly   Complete by: As directed      Allergies as of 11/24/2019   No Known Allergies     Medication List    TAKE these medications   hydrochlorothiazide 25 MG tablet Commonly known as: HYDRODIURIL Take 1 tablet (25 mg total) by mouth daily.   lisinopril 20 MG tablet Commonly known as: ZESTRIL Take 1 tablet (20 mg total) by mouth daily.         Time coordinating discharge: 32 minutes  Signed:  Jobani Sabado  [facility] 11/24/2019, 5:11 PM

## 2019-11-24 NOTE — Progress Notes (Signed)
Johnaton Hosterman to be D/C'd Home per MD order.  Discussed prescriptions and follow up appointments with the patient. Prescriptions given to patient, medication list explained in detail. Pt verbalized understanding.  Allergies as of 11/24/2019   No Known Allergies     Medication List    TAKE these medications   hydrochlorothiazide 25 MG tablet Commonly known as: HYDRODIURIL Take 1 tablet (25 mg total) by mouth daily.   lisinopril 20 MG tablet Commonly known as: ZESTRIL Take 1 tablet (20 mg total) by mouth daily.       Vitals:   11/24/19 1632 11/24/19 1634  BP: (!) 165/105 (!) 161/101  Pulse: 90 91  Resp:    Temp:    SpO2:      Skin clean, dry and intact without evidence of skin break down, no evidence of skin tears noted. IV catheter discontinued intact. Site without signs and symptoms of complications. Dressing and pressure applied. Pt denies pain at this time. No complaints noted.  An After Visit Summary was printed and given to the patient. Patient escorted via WC, and D/C home via private auto.  Madie Reno, RN

## 2019-11-24 NOTE — Progress Notes (Signed)
Patient requested something stronger for a headache. NP, Ouma placed a one time order for oxycodone 5mg . Will continue to monitor.  11/24/2019  4:28 AM

## 2019-12-21 ENCOUNTER — Emergency Department
Admission: EM | Admit: 2019-12-21 | Discharge: 2019-12-21 | Disposition: A | Discharge status: Home / Self Care | Payer: Self-pay | Attending: Student in an Organized Health Care Education/Training Program | Admitting: Student in an Organized Health Care Education/Training Program

## 2019-12-21 ENCOUNTER — Other Ambulatory Visit: Payer: Self-pay

## 2019-12-21 ENCOUNTER — Emergency Department: Payer: Self-pay

## 2019-12-21 ENCOUNTER — Encounter: Payer: Self-pay | Admitting: Emergency Medicine

## 2019-12-21 DIAGNOSIS — R079 Chest pain, unspecified: Secondary | ICD-10-CM

## 2019-12-21 DIAGNOSIS — Z566 Other physical and mental strain related to work: Secondary | ICD-10-CM | POA: Insufficient documentation

## 2019-12-21 DIAGNOSIS — I10 Essential (primary) hypertension: Secondary | ICD-10-CM | POA: Insufficient documentation

## 2019-12-21 DIAGNOSIS — Z79899 Other long term (current) drug therapy: Secondary | ICD-10-CM | POA: Insufficient documentation

## 2019-12-21 DIAGNOSIS — R072 Precordial pain: Secondary | ICD-10-CM | POA: Insufficient documentation

## 2019-12-21 DIAGNOSIS — F17210 Nicotine dependence, cigarettes, uncomplicated: Secondary | ICD-10-CM | POA: Insufficient documentation

## 2019-12-21 HISTORY — DX: Essential (primary) hypertension: I10

## 2019-12-21 LAB — URINE DRUG SCREEN, QUALITATIVE (ARMC ONLY)
Amphetamines, Ur Screen: NOT DETECTED
Barbiturates, Ur Screen: NOT DETECTED
Benzodiazepine, Ur Scrn: NOT DETECTED
Cannabinoid 50 Ng, Ur ~~LOC~~: POSITIVE — AB
Cocaine Metabolite,Ur ~~LOC~~: POSITIVE — AB
MDMA (Ecstasy)Ur Screen: NOT DETECTED
Methadone Scn, Ur: NOT DETECTED
Opiate, Ur Screen: NOT DETECTED
Phencyclidine (PCP) Ur S: NOT DETECTED
Tricyclic, Ur Screen: NOT DETECTED

## 2019-12-21 LAB — CBC
HCT: 43.2 % (ref 39.0–52.0)
Hemoglobin: 15 g/dL (ref 13.0–17.0)
MCH: 32 pg (ref 26.0–34.0)
MCHC: 34.7 g/dL (ref 30.0–36.0)
MCV: 92.1 fL (ref 80.0–100.0)
Platelets: 297 10*3/uL (ref 150–400)
RBC: 4.69 MIL/uL (ref 4.22–5.81)
RDW: 14.1 % (ref 11.5–15.5)
WBC: 7.3 10*3/uL (ref 4.0–10.5)
nRBC: 0 % (ref 0.0–0.2)

## 2019-12-21 LAB — COMPREHENSIVE METABOLIC PANEL
ALT: 21 U/L (ref 0–44)
AST: 24 U/L (ref 15–41)
Albumin: 4.6 g/dL (ref 3.5–5.0)
Alkaline Phosphatase: 57 U/L (ref 38–126)
Anion gap: 11 (ref 5–15)
BUN: 23 mg/dL — ABNORMAL HIGH (ref 6–20)
CO2: 28 mmol/L (ref 22–32)
Calcium: 9.2 mg/dL (ref 8.9–10.3)
Chloride: 100 mmol/L (ref 98–111)
Creatinine, Ser: 1.5 mg/dL — ABNORMAL HIGH (ref 0.61–1.24)
GFR calc Af Amer: >60 mL/min (ref >60)
GFR calc non Af Amer: 57 mL/min — ABNORMAL LOW (ref >60)
Glucose, Bld: 105 mg/dL — ABNORMAL HIGH (ref 70–99)
Potassium: 3.4 mmol/L — ABNORMAL LOW (ref 3.5–5.1)
Sodium: 139 mmol/L (ref 135–145)
Total Bilirubin: 0.7 mg/dL (ref 0.3–1.2)
Total Protein: 8.4 g/dL — ABNORMAL HIGH (ref 6.5–8.1)

## 2019-12-21 LAB — TROPONIN I (HIGH SENSITIVITY)
Troponin I (High Sensitivity): 14 ng/L (ref <18)
Troponin I (High Sensitivity): 16 ng/L (ref <18)

## 2019-12-21 NOTE — ED Notes (Signed)
Pt brought in via ems from work.  Pt reports elevated blood pressure and dizziness with chest pain today.  States i've been stressed out.  Denies SI or HI.  Denies drug or etoh use.  Pt in hallway recliner.  Pt alert  Speech clear.

## 2019-12-21 NOTE — ED Triage Notes (Signed)
Pt in via EMS from work with c/o CP and dizziness. 156/94, 99% RA, 103 HR. Pt has been under a lot of stress lately and made a comment about not wanting to be here anymore to the EMS .

## 2019-12-21 NOTE — ED Notes (Signed)
Pt ate dinner tray.

## 2019-12-21 NOTE — ED Notes (Signed)
Pt sleeping in hallway chair.  Repeat labs sent.

## 2019-12-21 NOTE — ED Provider Notes (Signed)
[facility] Emergency Department Provider Note    First MD Initiated Contact with Patient 12/21/19 1633     (approximate)  I have reviewed the triage vital signs and the nursing notes.   HISTORY  Chief Complaint Chest Pain, Hypertension, and Suicidal    HPI Andrew Walters is a 39 y.o. male below listed past medical history presents to the ER for 24 hours of midsternal stabbing chest pain nonradiating.  States the last intermittently 10 minutes or less.  Denies any diaphoresis.  Denies any pain when taking deep inspiration.  States has been under quite a bit of stress at work and at home.  He does smoke.  Denies any cough fever or chills.  Did admit to triage nurse that he has been feeling depressed and having passive suicidal ideation.  He denies any plan or active suicidal ideation at this time.  Would like to speak with psychiatry.  He is here voluntary.    Past Medical History:  Diagnosis Date  . Hypertension    No family history on file. History reviewed. No pertinent surgical history. Patient Active Problem List   Diagnosis Date Noted  . Syncope and collapse 11/22/2019  . Cocaine use 11/22/2019  . Marijuana abuse 11/22/2019  . AKI (acute kidney injury) (HCC) 11/22/2019  . HTN (hypertension) 11/22/2019  . Substance abuse (HCC)       Prior to Admission medications   Medication Sig Start Date End Date Taking? Authorizing Provider  hydrochlorothiazide (HYDRODIURIL) 25 MG tablet Take 1 tablet (25 mg total) by mouth daily. 11/24/19   Lurene Shadow, MD  lisinopril (ZESTRIL) 20 MG tablet Take 1 tablet (20 mg total) by mouth daily. 11/24/19   Lurene Shadow, MD    Allergies Patient has no known allergies.    Social History Social History   Tobacco Use  . Smoking status: Current Every Day Smoker  . Smokeless tobacco: Never Used  Substance Use Topics  . Alcohol use: No  . Drug use: No    Review of Systems Patient denies headaches,  rhinorrhea, blurry vision, numbness, shortness of breath, chest pain, edema, cough, abdominal pain, nausea, vomiting, diarrhea, dysuria, fevers, rashes or hallucinations unless otherwise stated above in HPI. ____________________________________________   PHYSICAL EXAM:  VITAL SIGNS: Vitals:   12/21/19 1547  BP: 140/81  Pulse: 93  Resp: 20  Temp: 98.4 F (36.9 C)  SpO2: 97%    Constitutional: Alert and oriented.  Eyes: Conjunctivae are normal.  Head: Atraumatic. Nose: No congestion/rhinnorhea. Mouth/Throat: Mucous membranes are moist.   Neck: No stridor. Painless ROM.  Cardiovascular: Normal rate, regular rhythm. Grossly normal heart sounds.  Good peripheral circulation. Respiratory: Normal respiratory effort.  No retractions. Lungs CTAB. Gastrointestinal: Soft and nontender. No distention. No abdominal bruits. No CVA tenderness. Genitourinary:  Musculoskeletal: No lower extremity tenderness nor edema.  No joint effusions. Neurologic:  Normal speech and language. No gross focal neurologic deficits are appreciated. No facial droop Skin:  Skin is warm, dry and intact. No rash noted. Psychiatric: Mood and affect are normal. Speech and behavior are normal.  ____________________________________________   LABS (all labs ordered are listed, but only abnormal results are displayed)  Results for orders placed or performed during the hospital encounter of 12/21/19 (from the past 24 hour(s))  Comprehensive metabolic panel     Status: Abnormal   Collection Time: 12/21/19  4:05 PM  Result Value Ref Range   Sodium 139 135 - 145 mmol/L   Potassium 3.4 (L) 3.5 -  5.1 mmol/L   Chloride 100 98 - 111 mmol/L   CO2 28 22 - 32 mmol/L   Glucose, Bld 105 (H) 70 - 99 mg/dL   BUN 23 (H) 6 - 20 mg/dL   Creatinine, Ser 1.50 (H) 0.61 - 1.24 mg/dL   Calcium 9.2 8.9 - 10.3 mg/dL   Total Protein 8.4 (H) 6.5 - 8.1 g/dL   Albumin 4.6 3.5 - 5.0 g/dL   AST 24 15 - 41 U/L   ALT 21 0 - 44 U/L   Alkaline  Phosphatase 57 38 - 126 U/L   Total Bilirubin 0.7 0.3 - 1.2 mg/dL   GFR calc non Af Amer 57 (L) >60 mL/min   GFR calc Af Amer >60 >60 mL/min   Anion gap 11 5 - 15  cbc     Status: None   Collection Time: 12/21/19  4:05 PM  Result Value Ref Range   WBC 7.3 4.0 - 10.5 K/uL   RBC 4.69 4.22 - 5.81 MIL/uL   Hemoglobin 15.0 13.0 - 17.0 g/dL   HCT 43.2 39.0 - 52.0 %   MCV 92.1 80.0 - 100.0 fL   MCH 32.0 26.0 - 34.0 pg   MCHC 34.7 30.0 - 36.0 g/dL   RDW 14.1 11.5 - 15.5 %   Platelets 297 150 - 400 K/uL   nRBC 0.0 0.0 - 0.2 %  Troponin I (High Sensitivity)     Status: None   Collection Time: 12/21/19  4:05 PM  Result Value Ref Range   Troponin I (High Sensitivity) 14 <18 ng/L  Troponin I (High Sensitivity)     Status: None   Collection Time: 12/21/19  6:11 PM  Result Value Ref Range   Troponin I (High Sensitivity) 16 <18 ng/L  Urine Drug Screen, Qualitative     Status: Abnormal   Collection Time: 12/21/19  7:16 PM  Result Value Ref Range   Tricyclic, Ur Screen NONE DETECTED NONE DETECTED   Amphetamines, Ur Screen NONE DETECTED NONE DETECTED   MDMA (Ecstasy)Ur Screen NONE DETECTED NONE DETECTED   Cocaine Metabolite,Ur [place] POSITIVE (A) NONE DETECTED   Opiate, Ur Screen NONE DETECTED NONE DETECTED   Phencyclidine (PCP) Ur S NONE DETECTED NONE DETECTED   Cannabinoid 50 Ng, Ur [place] POSITIVE (A) NONE DETECTED   Barbiturates, Ur Screen NONE DETECTED NONE DETECTED   Benzodiazepine, Ur Scrn NONE DETECTED NONE DETECTED   Methadone Scn, Ur NONE DETECTED NONE DETECTED   ____________________________________________  EKG My review and personal interpretation at Time: 15:52   Indication: chest pain  Rate: 90  Rhythm: sinus Axis: normal Other: normal intervals, poor r wave progression, no stemi ____________________________________________  RADIOLOGY  I personally reviewed all radiographic images ordered to evaluate for the above acute complaints and reviewed radiology reports and findings.   These findings were personally discussed with the patient.  Please see medical record for radiology report.  ____________________________________________   PROCEDURES  Procedure(s) performed:  Procedures    Critical Care performed: no ____________________________________________   INITIAL IMPRESSION / ASSESSMENT AND PLAN / ED COURSE  Pertinent labs & imaging results that were available during my care of the patient were reviewed by me and considered in my medical decision making (see chart for details).   DDX: acs, pericarditis, ptx, chf, pna, bronchitis, anxiety, adjustment reactions, depression, doubt pe or dissection  Chaynce Schafer is a 39 y.o. who presents to the ED with chest pain that started over the past 24 hours.  Denies any exertional or  positional component.  No diaphoresis.  Symptoms only lasting few moments in nature.  Denies any drug use.  Does endorse significant stressors at work and states he would like to speak with psychiatry.  Has been having some feelings of hopelessness but denies any plan.  States he has had episodes of passive suicidal ideations but never developed a plan and denies any suicidal ideation at this time.  Is low risk by Wells criteria and is PERC negative.  Does not seem consistent with dissection.  Abdominal exam is soft and benign.  The patient will be placed on continuous pulse oximetry and telemetry for monitoring.  Laboratory evaluation will be sent to evaluate for the above complaints.     Clinical Course as of Dec 20 2328  Thu Dec 21, 2019  2038 Patient up to nursing desk requesting discharge home.  His cardiac work-up has been reassuring.  Is positive for cocaine and cannabinoid.  Denies any use today.  He appears clinically sober and denies any SI or HI.  States he never actually had a plan just was feeling down while he was at work.  Patient does not meet criteria for involuntary commitment.  Do believe he stable and appropriate for outpatient  follow-up.   [PR]    Clinical Course User Index [PR] Willy Eddy, MD    The patient was evaluated in Emergency Department today for the symptoms described in the history of present illness. He/she was evaluated in the context of the global COVID-19 pandemic, which necessitated consideration that the patient might be at risk for infection with the SARS-CoV-2 virus that causes COVID-19. Institutional protocols and algorithms that pertain to the evaluation of patients at risk for COVID-19 are in a state of rapid change based on information released by regulatory bodies including the CDC and federal and state organizations. These policies and algorithms were followed during the patient's care in the ED.  As part of my medical decision making, I reviewed the following data within the electronic MEDICAL RECORD NUMBER Nursing notes reviewed and incorporated, Labs reviewed, notes from prior ED visits and [place] Controlled Substance Database   ____________________________________________   FINAL CLINICAL IMPRESSION(S) / ED DIAGNOSES  Final diagnoses:  Chest pain, unspecified type  Stress at work      NEW MEDICATIONS STARTED DURING THIS VISIT:  Discharge Medication List as of 12/21/2019  8:48 PM       Note:  This document was prepared using Dragon voice recognition software and may include unintentional dictation errors.    Willy Eddy, MD 12/21/19 2330

## 2019-12-21 NOTE — ED Triage Notes (Signed)
Pt reports CP to mid chest that is stabbing in nature. Pt also reports some HTN issues and reports has been under a lot of stress. Pt admits to SI. Pt appears depresses ed in triage.

## 2020-03-13 ENCOUNTER — Telehealth: Payer: Self-pay | Admitting: Pharmacist

## 2020-03-13 NOTE — Telephone Encounter (Signed)
Patient failed to provide requested 2021 financial documentation. No additional medication assistance will be provided by [facility] without the required proof of income documentation. Patient notified by letter Debra Cheek Administrative Assistant [facility] 

## 2020-04-28 ENCOUNTER — Emergency Department
Admission: EM | Admit: 2020-04-28 | Discharge: 2020-04-28 | Disposition: A | Discharge status: Home / Self Care | Payer: Self-pay | Attending: Emergency Medicine | Admitting: Emergency Medicine

## 2020-04-28 ENCOUNTER — Encounter: Payer: Self-pay | Admitting: Emergency Medicine

## 2020-04-28 ENCOUNTER — Other Ambulatory Visit: Payer: Self-pay

## 2020-04-28 ENCOUNTER — Emergency Department: Payer: Self-pay

## 2020-04-28 DIAGNOSIS — Z20822 Contact with and (suspected) exposure to covid-19: Secondary | ICD-10-CM | POA: Insufficient documentation

## 2020-04-28 DIAGNOSIS — B349 Viral infection, unspecified: Secondary | ICD-10-CM | POA: Insufficient documentation

## 2020-04-28 DIAGNOSIS — F17200 Nicotine dependence, unspecified, uncomplicated: Secondary | ICD-10-CM | POA: Insufficient documentation

## 2020-04-28 DIAGNOSIS — Z1152 Encounter for screening for COVID-19: Secondary | ICD-10-CM

## 2020-04-28 DIAGNOSIS — Z91199 Patient's noncompliance with other medical treatment and regimen due to unspecified reason: Secondary | ICD-10-CM

## 2020-04-28 DIAGNOSIS — I10 Essential (primary) hypertension: Secondary | ICD-10-CM | POA: Insufficient documentation

## 2020-04-28 DIAGNOSIS — Z79899 Other long term (current) drug therapy: Secondary | ICD-10-CM | POA: Insufficient documentation

## 2020-04-28 DIAGNOSIS — Z9119 Patient's noncompliance with other medical treatment and regimen: Secondary | ICD-10-CM | POA: Insufficient documentation

## 2020-04-28 LAB — RESPIRATORY PANEL BY RT PCR (FLU A&B, COVID)
Influenza A by PCR: NEGATIVE
Influenza B by PCR: NEGATIVE
SARS Coronavirus 2 by RT PCR: NEGATIVE

## 2020-04-28 MED ORDER — HYDROCHLOROTHIAZIDE 25 MG PO TABS: 25.0000 mg | ORAL_TABLET | Freq: Every day | ORAL | 2 refills | Status: DC

## 2020-04-28 MED ORDER — ACETAMINOPHEN 325 MG PO TABS
650.0000 mg | ORAL_TABLET | Freq: Once | ORAL | Status: AC
  Administered 2020-04-28: 650 mg via ORAL
  Filled 2020-04-28: qty 2

## 2020-04-28 MED ORDER — ONDANSETRON 4 MG PO TBDP
4.0000 mg | ORAL_TABLET | Freq: Three times a day (TID) | ORAL | 0 refills | Status: DC | PRN
Start: 2020-04-28 — End: 2021-05-02

## 2020-04-28 MED ORDER — LISINOPRIL 20 MG PO TABS: 20.0000 mg | ORAL_TABLET | Freq: Every day | ORAL | 2 refills | Status: DC

## 2020-04-28 MED ORDER — LISINOPRIL 10 MG PO TABS
20.0000 mg | ORAL_TABLET | Freq: Once | ORAL | Status: AC
  Administered 2020-04-28: 20 mg via ORAL
  Filled 2020-04-28: qty 2

## 2020-04-28 MED ORDER — HYDROCHLOROTHIAZIDE 25 MG PO TABS
25.0000 mg | ORAL_TABLET | Freq: Once | ORAL | Status: AC
  Administered 2020-04-28: 25 mg via ORAL
  Filled 2020-04-28: qty 1

## 2020-04-28 NOTE — Discharge Instructions (Addendum)
Begin taking Tylenol or ibuprofen as needed for muscle aches, headache, fever.  Increase fluids to stay hydrated.  Zofran if needed for nausea.  There is no specific medication for Covid.  Your test results will be seen in my chart.  If you are positive you may receive a phone call from the infusion treatment team for Covid.  Return to the emergency department if any severe worsening of your symptoms or difficulty breathing.  Also begin taking your blood pressure medication every day and follow-up with one of the clinics listed on your discharge papers as you do need a primary care provider to manage your blood pressure.  For your blood pressure to be uncontrolled increases your chances for stroke and heart attacks.

## 2020-04-28 NOTE — ED Notes (Signed)
Onset yesterday of weakness. Progressively worse. History of HTN - currently with BP of 185/115. Noncompliant with medication. 98% RA. Episodic vomiting as well.

## 2020-04-28 NOTE — ED Notes (Signed)
Pt was informed that his wife wants him to call her.

## 2020-04-28 NOTE — ED Triage Notes (Signed)
Pt has had body aches, cough, headache, sore throat, and some diarrhea for 2 days.  States "I just need to lay down".  Pt does not want to go back to lobby but explained there are no beds. Unlabored at this time. VSS.  Denies being around anyone sick.

## 2020-04-28 NOTE — ED Provider Notes (Signed)
[facility] Emergency Department Provider Note  ____________________________________________   First MD Initiated Contact with Patient 04/28/20 1123     (approximate)  I have reviewed the triage vital signs and the nursing notes.   HISTORY  Chief Complaint Generalized Body Aches   HPI Andrew Walters is a 39 y.o. male presents to the ED with complaint of body aches, cough, headache, sore throat and subjective fever.  Patient states he also has had diarrhea for the last 2 days and intermittent vomiting.  He is unaware of any known Covid exposure.  He reports that he did get [employer] vaccine.   Patient is hypertensive in the ED and states that he has not taken his blood pressure medication in "months". He reports that he does not even remember the last time he took his blood pressure medication. It appears that the last time a prescription was written was April 2021 for 30 days. Patient states that he never followed up with anyone and believes this to be the last time he took any blood pressure medication. He denies any chest pain or headache not related to his present illness.       Past Medical History:  Diagnosis Date  . Hypertension     Patient Active Problem List   Diagnosis Date Noted  . Syncope and collapse 11/22/2019  . Cocaine use 11/22/2019  . Marijuana abuse 11/22/2019  . AKI (acute kidney injury) (HCC) 11/22/2019  . HTN (hypertension) 11/22/2019  . Substance abuse (HCC)     History reviewed. No pertinent surgical history.  Prior to Admission medications   Medication Sig Start Date End Date Taking? Authorizing Provider  hydrochlorothiazide (HYDRODIURIL) 25 MG tablet Take 1 tablet (25 mg total) by mouth daily. 04/28/20   Tommi Rumps, PA-C  lisinopril (ZESTRIL) 20 MG tablet Take 1 tablet (20 mg total) by mouth daily. 04/28/20   Tommi Rumps, PA-C  ondansetron (ZOFRAN ODT) 4 MG disintegrating tablet Take 1 tablet (4 mg total) by mouth  every 8 (eight) hours as needed for nausea or vomiting. 04/28/20   Tommi Rumps, PA-C    Allergies Patient has no known allergies.  History reviewed. No pertinent family history.  Social History Social History   Tobacco Use  . Smoking status: Current Every Day Smoker  . Smokeless tobacco: Never Used  Substance Use Topics  . Alcohol use: No  . Drug use: No    Review of Systems Constitutional: Positive fever/chills Eyes: No visual changes. ENT: Positive sore throat. Cardiovascular: Denies chest pain. Respiratory: Denies shortness of breath. Positive cough. Gastrointestinal: No abdominal pain. Positive nausea, no vomiting. Positive diarrhea.  No constipation. Genitourinary: Negative for dysuria. Musculoskeletal: Positive for muscle aches. Skin: Negative for rash. Neurological: Positive for headaches, negative focal weakness or numbness. ____________________________________________   PHYSICAL EXAM:  VITAL SIGNS: ED Triage Vitals  Enc Vitals Group     BP 04/28/20 0914 (!) 177/100     Pulse Rate 04/28/20 0913 89     Resp 04/28/20 0913 18     Temp 04/28/20 0913 98.2 F (36.8 C)     Temp Source 04/28/20 0913 Oral     SpO2 04/28/20 0913 98 %     Weight 04/28/20 0914 225 lb (102.1 kg)     Height 04/28/20 0914 5\' 11"  (1.803 m)     Head Circumference --      Peak Flow --      Pain Score 04/28/20 0914 8  Pain Loc --      Pain Edu? --      Excl. in GC? --     Constitutional: Alert and oriented. Well appearing and in no acute distress. Eyes: Conjunctivae are normal. PERRL. EOMI. Head: Atraumatic. Nose: Moderate congestion/no rhinnorhea. Mouth/Throat: Mucous membranes are moist.  Oropharynx non-erythematous. Neck: No stridor.   Cardiovascular: Normal rate, regular rhythm. Grossly normal heart sounds.  Good peripheral circulation. Respiratory: Normal respiratory effort.  No retractions. Lungs CTAB. Coarse cough noted occasionally. Gastrointestinal: Soft and  nontender. No distention.  Musculoskeletal: Moves upper and lower extremities without any difficulty. Patient was noted to ambulate to the restroom without any assistance while in the emergency department. Neurologic:  Normal speech and language. No gross focal neurologic deficits are appreciated. No gait instability. Skin:  Skin is warm, dry and intact. No rash noted. Psychiatric: Mood and affect are normal. Speech and behavior are normal.  ____________________________________________   LABS (all labs ordered are listed, but only abnormal results are displayed)  Labs Reviewed  RESPIRATORY PANEL BY RT PCR (FLU A&B, COVID)     RADIOLOGY I, Tommi Rumps, personally viewed and evaluated these images (plain radiographs) as part of my medical decision making, as well as reviewing the written report by the radiologist.   Official radiology report(s): DG Chest Port 1 View  Result Date: 04/28/2020 CLINICAL DATA:  Cough, weakness, fully vaccinated. Smoker, denies hx of heart or ling disease EXAM: PORTABLE CHEST 1 VIEW COMPARISON:  Chest radiograph 12/21/2019 FINDINGS: Stable cardiomediastinal contours with enlarged heart size. There is no focal consolidation or overt edema. No pneumothorax or significant pleural effusion. No acute finding in the visualized skeleton. IMPRESSION: No evidence of active disease.  Cardiomegaly. Electronically Signed   By: Emmaline Kluver M.D.   On: 04/28/2020 12:02    ____________________________________________   PROCEDURES  Procedure(s) performed (including Critical Care):  Procedures   ____________________________________________   INITIAL IMPRESSION / ASSESSMENT AND PLAN / ED COURSE  As part of my medical decision making, I reviewed the following data within the electronic MEDICAL RECORD NUMBER Notes from prior ED visits and [place] Controlled Substance Database  39 year old male presents to the ED with complaint of generalized body aches, cough,  headache, sore throat, nausea and diarrhea for 2 days. Patient also complains of a subjective fever. He reports that he did have the Pfizer vaccine. Patient also has a history of hypertension and currently has not taken his blood pressure medication since approximately May. He denies any symptoms in relationship to his hypertension. We discussed increased risk for cardiovascular events such as stroke and heart attack. Patient was given lisinopril 20 mg and hydrochlorothiazide 25 mg while in the ED. A prescription for the same was sent to his pharmacy along with prescription for Zofran. Patient is aware that he can get the results of his Covid test on my chart. A note for work was written stating that he can return to work unless his Covid test is positive in which he will need an additional 12 days out of work. His return to the emergency department if any worsening of his symptoms such as difficulty breathing or shortness of breath.  ____________________________________________   FINAL CLINICAL IMPRESSION(S) / ED DIAGNOSES  Final diagnoses:  Viral syndrome  Encounter for screening for COVID-19  Poorly-controlled hypertension  Medically noncompliant     ED Discharge Orders         Ordered    lisinopril (ZESTRIL) 20 MG tablet  Daily  04/28/20 1353    hydrochlorothiazide (HYDRODIURIL) 25 MG tablet  Daily        04/28/20 1353    ondansetron (ZOFRAN ODT) 4 MG disintegrating tablet  Every 8 hours PRN        04/28/20 1359          *Please note:  Andrew Walters was evaluated in Emergency Department on 04/28/2020 for the symptoms described in the history of present illness. He was evaluated in the context of the global COVID-19 pandemic, which necessitated consideration that the patient might be at risk for infection with the SARS-CoV-2 virus that causes COVID-19. Institutional protocols and algorithms that pertain to the evaluation of patients at risk for COVID-19 are in a state of rapid  change based on information released by regulatory bodies including the CDC and federal and state organizations. These policies and algorithms were followed during the patient's care in the ED.  Some ED evaluations and interventions may be delayed as a result of limited staffing during and the pandemic.*   Note:  This document was prepared using Dragon voice recognition software and may include unintentional dictation errors.    Tommi Rumps, PA-C 04/28/20 1442    Merwyn Katos, MD 04/28/20 1536

## 2020-04-28 NOTE — ED Notes (Signed)
BP 206/117, Andrew Walters notified

## 2021-04-28 ENCOUNTER — Inpatient Hospital Stay
Admission: EM | Admit: 2021-04-28 | Discharge: 2021-05-02 | DRG: 291 | Disposition: A | Discharge status: Home / Self Care | Payer: Self-pay | Attending: Internal Medicine | Admitting: Internal Medicine

## 2021-04-28 ENCOUNTER — Emergency Department: Payer: Self-pay

## 2021-04-28 ENCOUNTER — Other Ambulatory Visit: Payer: Self-pay

## 2021-04-28 DIAGNOSIS — R197 Diarrhea, unspecified: Secondary | ICD-10-CM

## 2021-04-28 DIAGNOSIS — I509 Heart failure, unspecified: Secondary | ICD-10-CM

## 2021-04-28 DIAGNOSIS — N179 Acute kidney failure, unspecified: Secondary | ICD-10-CM | POA: Diagnosis present

## 2021-04-28 DIAGNOSIS — F1910 Other psychoactive substance abuse, uncomplicated: Secondary | ICD-10-CM | POA: Diagnosis present

## 2021-04-28 DIAGNOSIS — I5033 Acute on chronic diastolic (congestive) heart failure: Secondary | ICD-10-CM | POA: Diagnosis present

## 2021-04-28 DIAGNOSIS — F17200 Nicotine dependence, unspecified, uncomplicated: Secondary | ICD-10-CM | POA: Diagnosis present

## 2021-04-28 DIAGNOSIS — Z79899 Other long term (current) drug therapy: Secondary | ICD-10-CM

## 2021-04-28 DIAGNOSIS — I248 Other forms of acute ischemic heart disease: Secondary | ICD-10-CM | POA: Diagnosis present

## 2021-04-28 DIAGNOSIS — E860 Dehydration: Secondary | ICD-10-CM | POA: Diagnosis present

## 2021-04-28 DIAGNOSIS — E876 Hypokalemia: Secondary | ICD-10-CM

## 2021-04-28 DIAGNOSIS — I110 Hypertensive heart disease with heart failure: Principal | ICD-10-CM | POA: Diagnosis present

## 2021-04-28 DIAGNOSIS — R7989 Other specified abnormal findings of blood chemistry: Secondary | ICD-10-CM

## 2021-04-28 DIAGNOSIS — J40 Bronchitis, not specified as acute or chronic: Secondary | ICD-10-CM

## 2021-04-28 DIAGNOSIS — Z20822 Contact with and (suspected) exposure to covid-19: Secondary | ICD-10-CM | POA: Diagnosis present

## 2021-04-28 DIAGNOSIS — R079 Chest pain, unspecified: Secondary | ICD-10-CM

## 2021-04-28 DIAGNOSIS — Z720 Tobacco use: Secondary | ICD-10-CM

## 2021-04-28 DIAGNOSIS — R112 Nausea with vomiting, unspecified: Secondary | ICD-10-CM

## 2021-04-28 DIAGNOSIS — R778 Other specified abnormalities of plasma proteins: Secondary | ICD-10-CM

## 2021-04-28 DIAGNOSIS — I160 Hypertensive urgency: Secondary | ICD-10-CM

## 2021-04-28 DIAGNOSIS — I429 Cardiomyopathy, unspecified: Secondary | ICD-10-CM

## 2021-04-28 LAB — BASIC METABOLIC PANEL
Anion gap: 6 (ref 5–15)
BUN: 12 mg/dL (ref 6–20)
CO2: 30 mmol/L (ref 22–32)
Calcium: 8.9 mg/dL (ref 8.9–10.3)
Chloride: 103 mmol/L (ref 98–111)
Creatinine, Ser: 1.68 mg/dL — ABNORMAL HIGH (ref 0.61–1.24)
GFR, Estimated: 52 mL/min — ABNORMAL LOW (ref >60)
Glucose, Bld: 92 mg/dL (ref 70–99)
Potassium: 3.4 mmol/L — ABNORMAL LOW (ref 3.5–5.1)
Sodium: 139 mmol/L (ref 135–145)

## 2021-04-28 LAB — RESP PANEL BY RT-PCR (FLU A&B, COVID) ARPGX2
Influenza A by PCR: NEGATIVE
Influenza B by PCR: NEGATIVE
SARS Coronavirus 2 by RT PCR: NEGATIVE

## 2021-04-28 LAB — URINALYSIS, COMPLETE (UACMP) WITH MICROSCOPIC
Bacteria, UA: NONE SEEN
Bilirubin Urine: NEGATIVE
Glucose, UA: NEGATIVE mg/dL
Ketones, ur: NEGATIVE mg/dL
Nitrite: NEGATIVE
Protein, ur: 100 mg/dL — AB
Specific Gravity, Urine: 1.024 (ref 1.005–1.030)
pH: 5 (ref 5.0–8.0)

## 2021-04-28 LAB — TROPONIN I (HIGH SENSITIVITY)
Troponin I (High Sensitivity): 65 ng/L — ABNORMAL HIGH (ref <18)
Troponin I (High Sensitivity): 56 ng/L — ABNORMAL HIGH (ref <18)

## 2021-04-28 LAB — HEPATIC FUNCTION PANEL
ALT: 24 U/L (ref 0–44)
AST: 23 U/L (ref 15–41)
Albumin: 4.1 g/dL (ref 3.5–5.0)
Alkaline Phosphatase: 54 U/L (ref 38–126)
Bilirubin, Direct: <0.1 mg/dL (ref 0.0–0.2)
Total Bilirubin: 1.4 mg/dL — ABNORMAL HIGH (ref 0.3–1.2)
Total Protein: 7.4 g/dL (ref 6.5–8.1)

## 2021-04-28 LAB — BRAIN NATRIURETIC PEPTIDE: B Natriuretic Peptide: 396.2 pg/mL — ABNORMAL HIGH (ref 0.0–100.0)

## 2021-04-28 LAB — CBC
HCT: 47.1 % (ref 39.0–52.0)
Hemoglobin: 16.2 g/dL (ref 13.0–17.0)
MCH: 31.8 pg (ref 26.0–34.0)
MCHC: 34.4 g/dL (ref 30.0–36.0)
MCV: 92.4 fL (ref 80.0–100.0)
Platelets: 270 10*3/uL (ref 150–400)
RBC: 5.1 MIL/uL (ref 4.22–5.81)
RDW: 13.9 % (ref 11.5–15.5)
WBC: 6.4 10*3/uL (ref 4.0–10.5)
nRBC: 0 % (ref 0.0–0.2)

## 2021-04-28 LAB — LIPASE, BLOOD: Lipase: 58 U/L — ABNORMAL HIGH (ref 11–51)

## 2021-04-28 LAB — MAGNESIUM: Magnesium: 2.1 mg/dL (ref 1.7–2.4)

## 2021-04-28 MED ORDER — ACETAMINOPHEN 500 MG PO TABS
1000.0000 mg | ORAL_TABLET | Freq: Once | ORAL | Status: AC
  Administered 2021-04-28: 1000 mg via ORAL
  Filled 2021-04-28: qty 2

## 2021-04-28 MED ORDER — NITROGLYCERIN 0.4 MG SL SUBL: 0.4000 mg | SUBLINGUAL_TABLET | SUBLINGUAL | Status: DC | PRN

## 2021-04-28 MED ORDER — ASPIRIN 81 MG PO CHEW
324.0000 mg | CHEWABLE_TABLET | Freq: Once | ORAL | Status: AC
  Administered 2021-04-28: 324 mg via ORAL
  Filled 2021-04-28: qty 4

## 2021-04-28 MED ORDER — IOHEXOL 350 MG/ML SOLN
75.0000 mL | Freq: Once | INTRAVENOUS | Status: AC | PRN
  Administered 2021-04-28: 75 mL via INTRAVENOUS
  Filled 2021-04-28: qty 75

## 2021-04-28 MED ORDER — LISINOPRIL 10 MG PO TABS
20.0000 mg | ORAL_TABLET | Freq: Every day | ORAL | Status: DC
  Administered 2021-04-28 – 2021-04-29 (×2): 20 mg via ORAL
  Filled 2021-04-28 (×2): qty 2

## 2021-04-28 MED ORDER — POTASSIUM CHLORIDE CRYS ER 20 MEQ PO TBCR
40.0000 meq | EXTENDED_RELEASE_TABLET | Freq: Once | ORAL | Status: AC
  Administered 2021-04-28: 40 meq via ORAL
  Filled 2021-04-28: qty 2

## 2021-04-28 MED ORDER — LACTATED RINGERS IV BOLUS
500.0000 mL | Freq: Once | INTRAVENOUS | Status: AC
  Administered 2021-04-28: 500 mL via INTRAVENOUS

## 2021-04-28 MED ORDER — ONDANSETRON 4 MG PO TBDP
4.0000 mg | ORAL_TABLET | Freq: Once | ORAL | Status: AC
  Administered 2021-04-28: 4 mg via ORAL
  Filled 2021-04-28: qty 1

## 2021-04-28 NOTE — ED Provider Notes (Signed)
Emergency Medicine Provider Triage Evaluation Note  Andrew Walters, a 40 y.o. male  was evaluated in triage.  Pt complains of nausea, vomiting, diarrhea, sore throat, and chest pain.  Patient started with symptoms last night.  He denies any associated fevers, chills, or sweats..  Review of Systems  Positive: CP, SOB, NVD Negative: FCS  Physical Exam  BP (!) 148/98   Pulse 91   Temp 98 F (36.7 C)   Resp 18   SpO2 100%  Gen:   Awake, no distress NAD Resp:  Normal effort CTA MSK:   Moves extremities without difficulty  Other:  CVS: RRR  Medical Decision Making  Medically screening exam initiated at 2:59 PM.  Appropriate orders placed.  Andrew Walters was informed that the remainder of the evaluation will be completed by another provider, this initial triage assessment does not replace that evaluation, and the importance of remaining in the ED until their evaluation is complete.  Patient ED evaluation of multiple symptoms including chest pain, shortness of breath, NVD, and sore throat.  He reports onset of symptoms last night.   Lissa Hoard, PA-C 04/28/21 1502    Georga Hacking, MD 04/28/21 [phone]

## 2021-04-28 NOTE — ED Triage Notes (Signed)
Pt comes with c/o CP, SOB, N.V/D and sore throat. Pt states this all started last night.

## 2021-04-28 NOTE — ED Provider Notes (Signed)
[facility] Emergency Department Provider Note  ____________________________________________   Event Date/Time   First MD Initiated Contact with Patient 04/28/21 1933     (approximate)  I have reviewed the triage vital signs and the nursing notes.   HISTORY  Chief Complaint Chest Pain   HPI Andrew Walters is a 40 y.o. male with a past medical history of HTN (not currently taking any antihypertensives ) tobacco abuse, cannabis use who presents for assessment of constellation of symptoms that all seemed to start yesterday.  Patient states he has had a couple episodes of nonbloody nonbilious vomiting, chest pain, cough, shortness of breath, some achiness in his abdomen and back as well as some nonbloody diarrhea.  No sore throat, earache, hemoptysis, urinary symptoms, blood in stool, rash or extremity pain.  No recent falls or injuries.  Patient denies any recent EtOH or other illicit drug use besides THC.  States his chest pain is better now that we have started him that he is actually hungry because not eaten anything all day.         Past Medical History:  Diagnosis Date   Hypertension     Patient Active Problem List   Diagnosis Date Noted   Syncope and collapse 11/22/2019   Cocaine use 11/22/2019   Marijuana abuse 11/22/2019   AKI (acute kidney injury) (HCC) 11/22/2019   HTN (hypertension) 11/22/2019   Substance abuse (HCC)     History reviewed. No pertinent surgical history.  Prior to Admission medications   Medication Sig Start Date End Date Taking? Authorizing Provider  hydrochlorothiazide (HYDRODIURIL) 25 MG tablet Take 1 tablet (25 mg total) by mouth daily. 04/28/20   Tommi Rumps, PA-C  lisinopril (ZESTRIL) 20 MG tablet Take 1 tablet (20 mg total) by mouth daily. 04/28/20   Tommi Rumps, PA-C  ondansetron (ZOFRAN ODT) 4 MG disintegrating tablet Take 1 tablet (4 mg total) by mouth every 8 (eight) hours as needed for nausea or vomiting.  04/28/20   Tommi Rumps, PA-C    Allergies Patient has no known allergies.  No family history on file.  Social History Social History   Tobacco Use   Smoking status: Every Day   Smokeless tobacco: Never  Substance Use Topics   Alcohol use: No   Drug use: No    Review of Systems  Review of Systems  Constitutional:  Positive for malaise/fatigue. Negative for chills and fever.  HENT:  Positive for sore throat.   Eyes:  Negative for pain.  Respiratory:  Positive for cough and shortness of breath. Negative for stridor.   Cardiovascular:  Positive for chest pain.  Gastrointestinal:  Positive for abdominal pain, nausea and vomiting.  Genitourinary:  Negative for dysuria.  Musculoskeletal:  Positive for myalgias.  Skin:  Negative for rash.  Neurological:  Negative for seizures, loss of consciousness and headaches.  Psychiatric/Behavioral:  Negative for suicidal ideas.   All other systems reviewed and are negative.    ____________________________________________   PHYSICAL EXAM:  VITAL SIGNS: ED Triage Vitals [04/28/21 1456]  Enc Vitals Group     BP (!) 148/98     Pulse Rate 91     Resp 18     Temp 98 F (36.7 C)     Temp src      SpO2 100 %     Weight      Height      Head Circumference      Peak Flow  Pain Score 8     Pain Loc      Pain Edu?      Excl. in GC?    Vitals:   04/28/21 2045 04/28/21 2100  BP:  (!) 150/107  Pulse: 80 83  Resp: (!) 22 18  Temp:  98 F (36.7 C)  SpO2: 100% 100%   Physical Exam Vitals and nursing note reviewed.  Constitutional:      Appearance: He is well-developed.  HENT:     Head: Normocephalic and atraumatic.     Right Ear: External ear normal.     Left Ear: External ear normal.     Nose: Nose normal.  Eyes:     Conjunctiva/sclera: Conjunctivae normal.  Cardiovascular:     Rate and Rhythm: Normal rate and regular rhythm.     Heart sounds: No murmur heard. Pulmonary:     Effort: Pulmonary effort is normal.  No respiratory distress.     Breath sounds: Normal breath sounds.  Abdominal:     Palpations: Abdomen is soft.     Tenderness: There is no abdominal tenderness.  Musculoskeletal:     Cervical back: Neck supple.     Right lower leg: No edema.     Left lower leg: No edema.  Skin:    General: Skin is warm and dry.  Neurological:     Mental Status: He is alert and oriented to person, place, and time.  Psychiatric:        Mood and Affect: Mood normal.     ____________________________________________   LABS (all labs ordered are listed, but only abnormal results are displayed)  Labs Reviewed  BASIC METABOLIC PANEL - Abnormal; Notable for the following components:      Result Value   Potassium 3.4 (*)    Creatinine, Ser 1.68 (*)    GFR, Estimated 52 (*)    All other components within normal limits  BRAIN NATRIURETIC PEPTIDE - Abnormal; Notable for the following components:   B Natriuretic Peptide 396.2 (*)    All other components within normal limits  HEPATIC FUNCTION PANEL - Abnormal; Notable for the following components:   Total Bilirubin 1.4 (*)    All other components within normal limits  LIPASE, BLOOD - Abnormal; Notable for the following components:   Lipase 58 (*)    All other components within normal limits  URINALYSIS, COMPLETE (UACMP) WITH MICROSCOPIC - Abnormal; Notable for the following components:   Color, Urine YELLOW (*)    APPearance CLEAR (*)    Hgb urine dipstick SMALL (*)    Protein, ur 100 (*)    Leukocytes,Ua TRACE (*)    All other components within normal limits  TROPONIN I (HIGH SENSITIVITY) - Abnormal; Notable for the following components:   Troponin I (High Sensitivity) 65 (*)    All other components within normal limits  TROPONIN I (HIGH SENSITIVITY) - Abnormal; Notable for the following components:   Troponin I (High Sensitivity) 56 (*)    All other components within normal limits  RESP PANEL BY RT-PCR (FLU A&B, COVID) ARPGX2  CBC  MAGNESIUM    ____________________________________________  EKG  Sinus rhythm with PVC and QTc interval 490 without other clear evidence of acute ischemia.  Nonspecific ST change in aVL.  No other clearance of acute ischemia or significant arrhythmia. ____________________________________________  RADIOLOGY  ED MD interpretation: Chest x-ray shows cardiomegaly without overt pulmonary edema, pneumothorax, focal consolidation, effusion or other clear acute thoracic process.  CTA shows no evidence of  PE but does show some mild cardiomegaly and bilateral hilar lymphadenopathy.  No evidence of effusion, focal consolidation or other clear acute thoracic process.  Official radiology report(s): DG Chest 2 View  Result Date: 04/28/2021 CLINICAL DATA:  Chest pain, shortness of breath, nausea, vomiting, diarrhea, sore throat EXAM: CHEST - 2 VIEW COMPARISON:  Chest radiograph 04/28/2020 FINDINGS: The heart is enlarged, unchanged. The mediastinal contours are within normal limits. There is vascular congestion without overt pulmonary edema. There is no focal consolidation. There is no pleural effusion or pneumothorax. There is no acute osseous abnormality. IMPRESSION: Cardiomegaly. Otherwise, no radiographic evidence of acute cardiopulmonary process. Electronically Signed   By: Lesia Hausen M.D.   On: 04/28/2021 15:38   CT Angio Chest PE W and/or Wo Contrast  Result Date: 04/28/2021 CLINICAL DATA:  Chest pain, shortness of breath with nausea and vomiting. EXAM: CT ANGIOGRAPHY CHEST WITH CONTRAST TECHNIQUE: Multidetector CT imaging of the chest was performed using the standard protocol during bolus administration of intravenous contrast. Multiplanar CT image reconstructions and MIPs were obtained to evaluate the vascular anatomy. CONTRAST:  11mL OMNIPAQUE IOHEXOL 350 MG/ML SOLN COMPARISON:  None. FINDINGS: Cardiovascular: Satisfactory opacification of the pulmonary arteries to the segmental level. No evidence of pulmonary  embolism. There is mild cardiomegaly. No pericardial effusion. Mediastinum/Nodes: There is mild AP window and bilateral hilar lymphadenopathy. Thyroid gland, trachea, and esophagus demonstrate no significant findings. Lungs/Pleura: Mild linear atelectasis is seen within the left lung base. There is no evidence of acute infiltrate, pleural effusion or pneumothorax. Upper Abdomen: No acute abnormality. Musculoskeletal: No chest wall abnormality. No acute or significant osseous findings. Review of the MIP images confirms the above findings. IMPRESSION: 1. No CT evidence of pulmonary embolism or acute cardiopulmonary disease. 2. Mild cardiomegaly. 3. Mild AP window and bilateral hilar lymphadenopathy, likely reactive. Electronically Signed   By: Aram Candela M.D.   On: 04/28/2021 23:17    ____________________________________________   PROCEDURES  Procedure(s) performed (including Critical Care):  .1-3 Lead EKG Interpretation Performed by: Gilles Chiquito, MD Authorized by: Gilles Chiquito, MD     Interpretation: normal     ECG rate assessment: normal     Rhythm: sinus rhythm     Ectopy: none     ____________________________________________   INITIAL IMPRESSION / ASSESSMENT AND PLAN / ED COURSE     Presents with above-stated history exam for assessment of constellation of symptoms including nausea, vomiting, diarrhea, chest pain, cough, shortness of breath and some achiness throughout his abdomen and back.  No other clear associated symptoms.  On arrival he is hypertensive with otherwise stable vital signs on room air.  Differential includes acute gastroenteritis with a component of bronchitis as well, ACS, PE, pericarditis, myocarditis, cholecystitis, pancreatitis, pneumonia, anemia, metabolic derangements..  Sinus rhythm with PVC and QTc interval 490 without other clear evidence of acute ischemia.  Nonspecific ST change in aVL.  No other clearance of acute ischemia or significant  arrhythmia.  Troponin is elevated but stable at 65 and 56.  Patient states he is currently chest pain-free on my assessment.  I have a lower suspicion for ACS given absence of ischemic changes on ECG suspect some mild demand ischemia in the setting of likely viral bronchitis and gastroenteritis.   Chest x-ray shows cardiomegaly without overt pulmonary edema, pneumothorax, focal consolidation, effusion or other clear acute thoracic process.  CTA shows no evidence of PE but does show some mild cardiomegaly and bilateral hilar lymphadenopathy.  No evidence of effusion,  focal consolidation or other clear acute thoracic process.  BMP remarkable for K of 3.4 creatinine of 1.68 without any other significant electrolyte or metabolic derangements.  Seems creatinine has ranged from 1.07-1.89 of the last year.  CBC shows no leukocytosis or acute anemia.  Magnesium is within normal limits.  Function panel has no evidence of hepatitis or cholestasis.  UA is unremarkable.  Reassessment patient states his chest pain actually never got better and is still the same.  He states he is feeling very achy and short of breath.  While coughing his SPO2 noted to temperature decreased to 87% with back to 98% on 2 L nasal cannula  Given elevated troponins patient complaining of ongoing chest pain which I suspect is likely due to demand in the setting of bronchitis and gastroenteritis with mild CHF think it is reasonable for him to be observed.  I will admit to hospitalist service.   ____________________________________________   FINAL CLINICAL IMPRESSION(S) / ED DIAGNOSES  Final diagnoses:  Troponin I above reference range  Bronchitis  Nausea vomiting and diarrhea  Hypokalemia  Chest pain, unspecified type  Tobacco abuse    Medications  lisinopril (ZESTRIL) tablet 20 mg (20 mg Oral Given 04/28/21 2210)  nitroGLYCERIN (NITROSTAT) SL tablet 0.4 mg (has no administration in time range)  potassium chloride SA  (KLOR-CON) CR tablet 40 mEq (40 mEq Oral Given 04/28/21 2013)  aspirin chewable tablet 324 mg (324 mg Oral Given 04/28/21 2012)  ondansetron (ZOFRAN-ODT) disintegrating tablet 4 mg (4 mg Oral Given 04/28/21 2013)  lactated ringers bolus 500 mL (500 mLs Intravenous New Bag/Given 04/28/21 2100)  acetaminophen (TYLENOL) tablet 1,000 mg (1,000 mg Oral Given 04/28/21 2210)  iohexol (OMNIPAQUE) 350 MG/ML injection 75 mL (75 mLs Intravenous Contrast Given 04/28/21 2306)     ED Discharge Orders     None        Note:  This document was prepared using Dragon voice recognition software and may include unintentional dictation errors.    Gilles Chiquito, MD 04/28/21 [phone]

## 2021-04-28 NOTE — ED Notes (Signed)
Pt to radiology for ct

## 2021-04-29 ENCOUNTER — Encounter: Payer: Self-pay | Admitting: Internal Medicine

## 2021-04-29 ENCOUNTER — Observation Stay: Admit: 2021-04-29 | Discharge: 2021-04-29 | Disposition: A | Discharge status: Home / Self Care | Payer: Self-pay

## 2021-04-29 DIAGNOSIS — I509 Heart failure, unspecified: Secondary | ICD-10-CM

## 2021-04-29 DIAGNOSIS — R079 Chest pain, unspecified: Secondary | ICD-10-CM

## 2021-04-29 DIAGNOSIS — R112 Nausea with vomiting, unspecified: Secondary | ICD-10-CM

## 2021-04-29 DIAGNOSIS — I160 Hypertensive urgency: Secondary | ICD-10-CM

## 2021-04-29 DIAGNOSIS — I5041 Acute combined systolic (congestive) and diastolic (congestive) heart failure: Secondary | ICD-10-CM

## 2021-04-29 LAB — CBC
HCT: 45.2 % (ref 39.0–52.0)
Hemoglobin: 15.4 g/dL (ref 13.0–17.0)
MCH: 32.1 pg (ref 26.0–34.0)
MCHC: 34.1 g/dL (ref 30.0–36.0)
MCV: 94.2 fL (ref 80.0–100.0)
Platelets: 245 10*3/uL (ref 150–400)
RBC: 4.8 MIL/uL (ref 4.22–5.81)
RDW: 13.8 % (ref 11.5–15.5)
WBC: 6.2 10*3/uL (ref 4.0–10.5)
nRBC: 0 % (ref 0.0–0.2)

## 2021-04-29 LAB — ECHOCARDIOGRAM COMPLETE
AR max vel: 2.12 cm2
AV Area VTI: 2.44 cm2
AV Area mean vel: 2.03 cm2
AV Mean grad: 3 mmHg
AV Peak grad: 5.6 mmHg
Ao pk vel: 1.18 m/s
Area-P 1/2: 5.09 cm2
Calc EF: 35 %
Height: 71 in
S' Lateral: 5.4 cm
Single Plane A2C EF: 26.8 %
Single Plane A4C EF: 39.1 %
Weight: 3680 oz

## 2021-04-29 LAB — URINE DRUG SCREEN, QUALITATIVE (ARMC ONLY)
Amphetamines, Ur Screen: NOT DETECTED
Barbiturates, Ur Screen: NOT DETECTED
Benzodiazepine, Ur Scrn: NOT DETECTED
Cannabinoid 50 Ng, Ur ~~LOC~~: POSITIVE — AB
Cocaine Metabolite,Ur ~~LOC~~: NOT DETECTED
MDMA (Ecstasy)Ur Screen: NOT DETECTED
Methadone Scn, Ur: NOT DETECTED
Opiate, Ur Screen: NOT DETECTED
Phencyclidine (PCP) Ur S: NOT DETECTED
Tricyclic, Ur Screen: NOT DETECTED

## 2021-04-29 LAB — HIV ANTIBODY (ROUTINE TESTING W REFLEX): HIV Screen 4th Generation wRfx: NONREACTIVE

## 2021-04-29 LAB — CREATININE, SERUM
Creatinine, Ser: 1.41 mg/dL — ABNORMAL HIGH (ref 0.61–1.24)
GFR, Estimated: >60 mL/min (ref >60)

## 2021-04-29 MED ORDER — SODIUM CHLORIDE 0.9% FLUSH: 3.0000 mL | INTRAVENOUS | Status: DC | PRN

## 2021-04-29 MED ORDER — ENOXAPARIN SODIUM 60 MG/0.6ML IJ SOSY
0.5000 mg/kg | PREFILLED_SYRINGE | INTRAMUSCULAR | Status: DC
  Administered 2021-04-29 – 2021-05-02 (×3): 52.5 mg via SUBCUTANEOUS
  Filled 2021-04-29: qty 0.6
  Filled 2021-04-29 (×3): qty 0.53

## 2021-04-29 MED ORDER — ACETAMINOPHEN 500 MG PO TABS
1000.0000 mg | ORAL_TABLET | Freq: Three times a day (TID) | ORAL | Status: DC | PRN
  Administered 2021-04-29 – 2021-05-01 (×3): 1000 mg via ORAL
  Filled 2021-04-29 (×4): qty 2

## 2021-04-29 MED ORDER — SACUBITRIL-VALSARTAN 24-26 MG PO TABS
1.0000 | ORAL_TABLET | Freq: Two times a day (BID) | ORAL | Status: DC
  Administered 2021-04-30 – 2021-05-01 (×2): 1 via ORAL
  Filled 2021-04-29 (×2): qty 1

## 2021-04-29 MED ORDER — ACETAMINOPHEN 325 MG PO TABS: 650.0000 mg | ORAL_TABLET | ORAL | Status: DC | PRN

## 2021-04-29 MED ORDER — SACUBITRIL-VALSARTAN 24-26 MG PO TABS: 1.0000 | ORAL_TABLET | Freq: Two times a day (BID) | ORAL | Status: DC

## 2021-04-29 MED ORDER — LACTATED RINGERS IV BOLUS
500.0000 mL | Freq: Once | INTRAVENOUS | Status: AC
  Administered 2021-04-29: 500 mL via INTRAVENOUS

## 2021-04-29 MED ORDER — SODIUM CHLORIDE 0.9% FLUSH
3.0000 mL | Freq: Two times a day (BID) | INTRAVENOUS | Status: DC
  Administered 2021-04-29 – 2021-05-02 (×8): 3 mL via INTRAVENOUS

## 2021-04-29 MED ORDER — METOPROLOL TARTRATE 25 MG PO TABS
12.5000 mg | ORAL_TABLET | Freq: Two times a day (BID) | ORAL | Status: DC
  Administered 2021-04-29 (×2): 12.5 mg via ORAL
  Filled 2021-04-29 (×2): qty 1

## 2021-04-29 MED ORDER — SODIUM CHLORIDE 0.9 % IV SOLN: 250.0000 mL | INTRAVENOUS | Status: DC | PRN

## 2021-04-29 MED ORDER — FUROSEMIDE 10 MG/ML IJ SOLN
20.0000 mg | Freq: Two times a day (BID) | INTRAMUSCULAR | Status: AC
Start: 2021-04-29 — End: 2021-04-29
  Administered 2021-04-29 (×2): 20 mg via INTRAVENOUS
  Filled 2021-04-29 (×2): qty 4

## 2021-04-29 MED ORDER — ONDANSETRON HCL 4 MG/2ML IJ SOLN
4.0000 mg | Freq: Four times a day (QID) | INTRAMUSCULAR | Status: DC | PRN
  Administered 2021-04-29 – 2021-05-01 (×2): 4 mg via INTRAVENOUS
  Filled 2021-04-29 (×2): qty 2

## 2021-04-29 MED ORDER — PANTOPRAZOLE SODIUM 40 MG IV SOLR
40.0000 mg | INTRAVENOUS | Status: DC
  Administered 2021-04-29: 40 mg via INTRAVENOUS
  Filled 2021-04-29: qty 40

## 2021-04-29 MED ORDER — METOPROLOL SUCCINATE ER 25 MG PO TB24
25.0000 mg | ORAL_TABLET | Freq: Every day | ORAL | Status: DC
  Administered 2021-04-29 – 2021-04-30 (×2): 25 mg via ORAL
  Filled 2021-04-29 (×2): qty 1

## 2021-04-29 NOTE — Consult Note (Signed)
Andrew Walters is a 40 y.o. male  [id number]  Primary Cardiologist: Adrian Blackwater, MD Reason for Consultation: chest pain, hypertension  HPI: Andrew Walters is a 40 y.o. male with medical history significant for Hypertension not currently on antihypertensives and tobacco and cannabis use who presents to the ED with a 1 day history of chest pain, cough and shortness of breath associated with nonbloody nonbilious vomiting as well as some nonbloody diarrhea.  He denies fever or chills.  Describes chest pain as a tightness in his anterior chest that comes and goes and not exertional, not radiating.   Review of Systems: Patient reports intermittent chest tightness center of his chest, shortness of breath. Denies dizziness.    Past Medical History:  Diagnosis Date   Hypertension     (Not in a hospital admission)     enoxaparin (LOVENOX) injection  0.5 mg/kg Subcutaneous Q24H   furosemide  20 mg Intravenous Q12H   lisinopril  20 mg Oral Daily   metoprolol tartrate  12.5 mg Oral BID   pantoprazole (PROTONIX) IV  40 mg Intravenous Q24H   sodium chloride flush  3 mL Intravenous Q12H    Infusions:  sodium chloride      No Known Allergies  Social History   Socioeconomic History   Marital status: Married    Spouse name: Not on file   Number of children: Not on file   Years of education: Not on file   Highest education level: Not on file  Occupational History   Not on file  Tobacco Use   Smoking status: Every Day   Smokeless tobacco: Never  Substance and Sexual Activity   Alcohol use: No   Drug use: No   Sexual activity: Not on file  Other Topics Concern   Not on file  Social History Narrative   Not on file   Social Determinants of Health   Financial Resource Strain: Not on file  Food Insecurity: Not on file  Transportation Needs: Not on file  Physical Activity: Not on file  Stress: Not on file  Social Connections: Not on file  Intimate Partner Violence: Not on file     History reviewed. No pertinent family history.  PHYSICAL EXAM: Vitals:   04/29/21 0612 04/29/21 0700  BP: (!) 136/103 (!) 136/102  Pulse: (!) 47 63  Resp: 17 15  Temp: 98.1 F (36.7 C)   SpO2: 99% 100%     Intake/Output Summary (Last 24 hours) at 04/29/2021 0856 Last data filed at 04/29/2021 0522 Gross per 24 hour  Intake 500.72 ml  Output 700 ml  Net -199.28 ml    General:  Well appearing. No respiratory difficulty HEENT: normal Neck: supple. no JVD. Carotids 2+ bilat; no bruits. No lymphadenopathy or thryomegaly appreciated. Cor: PMI nondisplaced. Regular rate & rhythm. No rubs, gallops or murmurs. Lungs: clear Abdomen: soft, nontender, nondistended. No hepatosplenomegaly. No bruits or masses. Good bowel sounds. Extremities: no cyanosis, clubbing, rash, edema Neuro: alert & oriented x 3, cranial nerves grossly intact. moves all 4 extremities w/o difficulty. Affect pleasant.  ECG: sinus rhythm with occasional PVCs, prolonged QT, HR 85 bpm  Results for orders placed or performed during the hospital encounter of 04/28/21 (from the past 24 hour(s))  Basic metabolic panel     Status: Abnormal   Collection Time: 04/28/21  2:57 PM  Result Value Ref Range   Sodium 139 135 - 145 mmol/L   Potassium 3.4 (L) 3.5 - 5.1 mmol/L   Chloride  103 98 - 111 mmol/L   CO2 30 22 - 32 mmol/L   Glucose, Bld 92 70 - 99 mg/dL   BUN 12 6 - 20 mg/dL   Creatinine, Ser 0.86 (H) 0.61 - 1.24 mg/dL   Calcium 8.9 8.9 - 57.8 mg/dL   GFR, Estimated 52 (L) >60 mL/min   Anion gap 6 5 - 15  CBC     Status: None   Collection Time: 04/28/21  2:57 PM  Result Value Ref Range   WBC 6.4 4.0 - 10.5 K/uL   RBC 5.10 4.22 - 5.81 MIL/uL   Hemoglobin 16.2 13.0 - 17.0 g/dL   HCT 46.9 62.9 - 52.8 %   MCV 92.4 80.0 - 100.0 fL   MCH 31.8 26.0 - 34.0 pg   MCHC 34.4 30.0 - 36.0 g/dL   RDW 41.3 24.4 - 01.0 %   Platelets 270 150 - 400 K/uL   nRBC 0.0 0.0 - 0.2 %  Troponin I (High Sensitivity)     Status:  Abnormal   Collection Time: 04/28/21  2:57 PM  Result Value Ref Range   Troponin I (High Sensitivity) 65 (H) <18 ng/L  Brain natriuretic peptide     Status: Abnormal   Collection Time: 04/28/21  2:57 PM  Result Value Ref Range   B Natriuretic Peptide 396.2 (H) 0.0 - 100.0 pg/mL  Troponin I (High Sensitivity)     Status: Abnormal   Collection Time: 04/28/21  7:00 PM  Result Value Ref Range   Troponin I (High Sensitivity) 56 (H) <18 ng/L  Magnesium     Status: None   Collection Time: 04/28/21  7:00 PM  Result Value Ref Range   Magnesium 2.1 1.7 - 2.4 mg/dL  Hepatic function panel     Status: Abnormal   Collection Time: 04/28/21  7:00 PM  Result Value Ref Range   Total Protein 7.4 6.5 - 8.1 g/dL   Albumin 4.1 3.5 - 5.0 g/dL   AST 23 15 - 41 U/L   ALT 24 0 - 44 U/L   Alkaline Phosphatase 54 38 - 126 U/L   Total Bilirubin 1.4 (H) 0.3 - 1.2 mg/dL   Bilirubin, Direct <2.7 0.0 - 0.2 mg/dL   Indirect Bilirubin NOT CALCULATED 0.3 - 0.9 mg/dL  Lipase, blood     Status: Abnormal   Collection Time: 04/28/21  7:00 PM  Result Value Ref Range   Lipase 58 (H) 11 - 51 U/L  Urinalysis, Complete w Microscopic     Status: Abnormal   Collection Time: 04/28/21  9:15 PM  Result Value Ref Range   Color, Urine YELLOW (A) YELLOW   APPearance CLEAR (A) CLEAR   Specific Gravity, Urine 1.024 1.005 - 1.030   pH 5.0 5.0 - 8.0   Glucose, UA NEGATIVE NEGATIVE mg/dL   Hgb urine dipstick SMALL (A) NEGATIVE   Bilirubin Urine NEGATIVE NEGATIVE   Ketones, ur NEGATIVE NEGATIVE mg/dL   Protein, ur 253 (A) NEGATIVE mg/dL   Nitrite NEGATIVE NEGATIVE   Leukocytes,Ua TRACE (A) NEGATIVE   RBC / HPF 6-10 0 - 5 RBC/hpf   WBC, UA 6-10 0 - 5 WBC/hpf   Bacteria, UA NONE SEEN NONE SEEN   Squamous Epithelial / LPF 0-5 0 - 5   Mucus PRESENT   Resp Panel by RT-PCR (Flu A&B, Covid) Nasopharyngeal Swab     Status: None   Collection Time: 04/28/21  9:24 PM   Specimen: Nasopharyngeal Swab; Nasopharyngeal(NP) swabs in vial  transport medium  Result Value Ref Range   SARS Coronavirus 2 by RT PCR NEGATIVE NEGATIVE   Influenza A by PCR NEGATIVE NEGATIVE   Influenza B by PCR NEGATIVE NEGATIVE  CBC     Status: None   Collection Time: 04/29/21  4:51 AM  Result Value Ref Range   WBC 6.2 4.0 - 10.5 K/uL   RBC 4.80 4.22 - 5.81 MIL/uL   Hemoglobin 15.4 13.0 - 17.0 g/dL   HCT 74.1 28.7 - 86.7 %   MCV 94.2 80.0 - 100.0 fL   MCH 32.1 26.0 - 34.0 pg   MCHC 34.1 30.0 - 36.0 g/dL   RDW 67.2 09.4 - 70.9 %   Platelets 245 150 - 400 K/uL   nRBC 0.0 0.0 - 0.2 %  Creatinine, serum     Status: Abnormal   Collection Time: 04/29/21  4:51 AM  Result Value Ref Range   Creatinine, Ser 1.41 (H) 0.61 - 1.24 mg/dL   GFR, Estimated >62 >83 mL/min   DG Chest 2 View  Result Date: 04/28/2021 CLINICAL DATA:  Chest pain, shortness of breath, nausea, vomiting, diarrhea, sore throat EXAM: CHEST - 2 VIEW COMPARISON:  Chest radiograph 04/28/2020 FINDINGS: The heart is enlarged, unchanged. The mediastinal contours are within normal limits. There is vascular congestion without overt pulmonary edema. There is no focal consolidation. There is no pleural effusion or pneumothorax. There is no acute osseous abnormality. IMPRESSION: Cardiomegaly. Otherwise, no radiographic evidence of acute cardiopulmonary process. Electronically Signed   By: Lesia Hausen M.D.   On: 04/28/2021 15:38   CT Angio Chest PE W and/or Wo Contrast  Result Date: 04/28/2021 CLINICAL DATA:  Chest pain, shortness of breath with nausea and vomiting. EXAM: CT ANGIOGRAPHY CHEST WITH CONTRAST TECHNIQUE: Multidetector CT imaging of the chest was performed using the standard protocol during bolus administration of intravenous contrast. Multiplanar CT image reconstructions and MIPs were obtained to evaluate the vascular anatomy. CONTRAST:  62mL OMNIPAQUE IOHEXOL 350 MG/ML SOLN COMPARISON:  None. FINDINGS: Cardiovascular: Satisfactory opacification of the pulmonary arteries to the  segmental level. No evidence of pulmonary embolism. There is mild cardiomegaly. No pericardial effusion. Mediastinum/Nodes: There is mild AP window and bilateral hilar lymphadenopathy. Thyroid gland, trachea, and esophagus demonstrate no significant findings. Lungs/Pleura: Mild linear atelectasis is seen within the left lung base. There is no evidence of acute infiltrate, pleural effusion or pneumothorax. Upper Abdomen: No acute abnormality. Musculoskeletal: No chest wall abnormality. No acute or significant osseous findings. Review of the MIP images confirms the above findings. IMPRESSION: 1. No CT evidence of pulmonary embolism or acute cardiopulmonary disease. 2. Mild cardiomegaly. 3. Mild AP window and bilateral hilar lymphadenopathy, likely reactive. Electronically Signed   By: Aram Candela M.D.   On: 04/28/2021 23:17     ASSESSMENT AND PLAN: Possible demand ischemia. Echo today. Patient already taking lisinopril, metoprolol, lasix. Will continue to observe.  Museum/gallery conservator FNP-C

## 2021-04-29 NOTE — Progress Notes (Signed)
*  PRELIMINARY RESULTS* Echocardiogram 2D Echocardiogram has been performed.  Cristela Blue 04/29/2021, 10:17 AM

## 2021-04-29 NOTE — Progress Notes (Signed)
PHARMACIST - PHYSICIAN COMMUNICATION  CONCERNING:  Enoxaparin (Lovenox) for DVT Prophylaxis   DESCRIPTION: Patient was prescribed enoxaprin 40mg  q24 hours for VTE prophylaxis.   Filed Weights   04/29/21 0137  Weight: 104.3 kg (230 lb)    Body mass index is 32.08 kg/m.  Estimated Creatinine Clearance: 71.1 mL/min (A) (by C-G formula based on SCr of 1.68 mg/dL (H)).   Based on [facility] policy patient is candidate for enoxaparin 0.5mg /kg TBW SQ every 24 hours based on BMI being >30.   RECOMMENDATION: Pharmacy has adjusted enoxaparin dose per [facility] policy.  Patient is now receiving enoxaparin 52.5 mg every 24 hours    [facility], PharmD Clinical Pharmacist  04/29/2021 1:43 AM

## 2021-04-29 NOTE — ED Notes (Signed)
Patient is resting comfortably. NAD

## 2021-04-29 NOTE — Progress Notes (Addendum)
PROGRESS NOTE    Faron Tudisco  DTO:[id number] DOB: Jun 17, 1981 DOA: 04/28/2021 PCP: Patient, [name] (Inactive)  240A/240A-BB   Assessment & Plan:   Active Problems:   AKI (acute kidney injury) (HCC)   Substance abuse (HCC)   Chest pain   Hypertensive urgency   Nausea and vomiting   Acute CHF (congestive heart failure) (HCC)   Jyair Kiraly is a 40 y.o. male with medical history significant for Hypertension not currently on antihypertensives and tobacco and cannabis use who presents to the ED with a 1 day history of chest pain, cough and shortness of breath associated with nonbloody nonbilious vomiting as well as some nonbloody diarrhea.     Chest pain, resolved elevated troponin likely due to demand ischemia - Suspect related to hypertensive urgency/emergency as well as vomiting - CTA chest negative for PE - Suspect demand ischemia.  EKG nonacute and troponin downtrending from 65-56 --cardiology consulted, no further recommendation for now  Severe combined CHF, chronic Cardiomyopathy, unclear type --Echo today showed LVEF 25-30%, worsened from 45-50% about 1.5 years ago.  Risk factors include past cocaine use and uncontrolled HTN. --does not appear to be in exacerbation, currently on room air. --Pt currently is not taking any medications at home and does not have a PCP. --Cardiology consulted. --started on Toprol and Entresto  Plan: --cont Toprol and Entresto --further eval and management per cardiology  Hypertensive urgency  - BP as high as 178/125 in the ED --pt was not taking any BP medications at home Plan: --cont Toprol and Entresto (both new)     AKI, ruled out - Creatinine 1.68 on presentation, which is around baseline.  Nausea, vomiting, diarrhea - Possible viral illness, or cannabis use. - Received a gentle bolus in the ED --supportive care --Soft diet as tolerated  Hx of cocaine use --Pt reported quitting about 7 months ago.  UDS today neg for  cocaine. --continued cessation encouraged due to cardiomyopathy    DVT prophylaxis: Lovenox SQ Code Status: Full code  Family Communication: wife updated on the phone today.  Level of care: Progressive Cardiac Dispo:   The patient is from: home Anticipated d/c is to: home Anticipated d/c date is: likely tomorrow  Pt will need medication sent to [facility] and set up with [facility].  Patient currently is not medically ready to d/c due to: pending cardiology eval and clearance   Subjective and Interval History:  Pt reported chest pain improved.  Reported dyspnea which is chronic.  Also complained of diarrhea.  Echo showed severe sCHF with worsening LVEF.  Pt reported he had stopped using cocaine about 7 months ago.   Objective: Vitals:   04/29/21 1600 04/29/21 1700 04/29/21 1846 04/29/21 1923  BP: (!) 159/99 (!) 144/101 (!) 150/98 136/84  Pulse:   77 79  Resp:   20 20  Temp:   98.7 F (37.1 C) 98 F (36.7 C)  TempSrc:   Oral Oral  SpO2:   99% 97%  Weight:      Height:        Intake/Output Summary (Last 24 hours) at 04/29/2021 2352 Last data filed at 04/29/2021 1848 Gross per 24 hour  Intake 1100.72 ml  Output 2600 ml  Net -1499.28 ml   Filed Weights   04/29/21 0137  Weight: 104.3 kg    Examination:   Constitutional: NAD, AAOx3 HEENT: conjunctivae and lids normal, EOMI CV: No cyanosis.   RESP: normal respiratory effort, on RA Neuro: II -  XII grossly intact.   Psych: flat mood and affect.     Data Reviewed: I have personally reviewed following labs and imaging studies  CBC: Recent Labs  Lab 04/28/21 1457 04/29/21 0451  WBC 6.4 6.2  HGB 16.2 15.4  HCT 47.1 45.2  MCV 92.4 94.2  PLT 270 245   Basic Metabolic Panel: Recent Labs  Lab 04/28/21 1457 04/28/21 1900 04/29/21 0451  NA 139  --   --   K 3.4*  --   --   CL 103  --   --   CO2 30  --   --   GLUCOSE 92  --   --   BUN 12  --   --   CREATININE 1.68*  --  1.41*  CALCIUM 8.9  --    --   MG  --  2.1  --    GFR: Estimated Creatinine Clearance: 84.7 mL/min (A) (by C-G formula based on SCr of 1.41 mg/dL (H)). Liver Function Tests: Recent Labs  Lab 04/28/21 1900  AST 23  ALT 24  ALKPHOS 54  BILITOT 1.4*  PROT 7.4  ALBUMIN 4.1   Recent Labs  Lab 04/28/21 1900  LIPASE 58*   No results for input(s): AMMONIA in the last 168 hours. Coagulation Profile: No results for input(s): INR, PROTIME in the last 168 hours. Cardiac Enzymes: No results for input(s): CKTOTAL, CKMB, CKMBINDEX, TROPONINI in the last 168 hours. BNP (last 3 results) No results for input(s): PROBNP in the last 8760 hours. HbA1C: No results for input(s): HGBA1C in the last 72 hours. CBG: No results for input(s): GLUCAP in the last 168 hours. Lipid Profile: No results for input(s): CHOL, HDL, LDLCALC, TRIG, CHOLHDL, LDLDIRECT in the last 72 hours. Thyroid Function Tests: No results for input(s): TSH, T4TOTAL, FREET4, T3FREE, THYROIDAB in the last 72 hours. Anemia Panel: No results for input(s): VITAMINB12, FOLATE, FERRITIN, TIBC, IRON, RETICCTPCT in the last 72 hours. Sepsis Labs: No results for input(s): PROCALCITON, LATICACIDVEN in the last 168 hours.  Recent Results (from the past 240 hour(s))  Resp Panel by RT-PCR (Flu A&B, Covid) Nasopharyngeal Swab     Status: None   Collection Time: 04/28/21  9:24 PM   Specimen: Nasopharyngeal Swab; Nasopharyngeal(NP) swabs in vial transport medium  Result Value Ref Range Status   SARS Coronavirus 2 by RT PCR NEGATIVE NEGATIVE Final    Comment: (NOTE) SARS-CoV-2 target nucleic acids are NOT DETECTED.  The SARS-CoV-2 RNA is generally detectable in upper respiratory specimens during the acute phase of infection. The lowest concentration of SARS-CoV-2 viral copies this assay can detect is 138 copies/mL. A negative result does not preclude SARS-Cov-2 infection and should not be used as the sole basis for treatment or other patient management  decisions. A negative result may occur with  improper specimen collection/handling, submission of specimen other than nasopharyngeal swab, presence of viral mutation(s) within the areas targeted by this assay, and inadequate number of viral copies(<138 copies/mL). A negative result must be combined with clinical observations, patient history, and epidemiological information. The expected result is Negative.  Fact Sheet for Patients:  BloggerCourse.com  Fact Sheet for Healthcare Providers:  SeriousBroker.it  This test is no t yet approved or cleared by the [place] FDA and  has been authorized for detection and/or diagnosis of SARS-CoV-2 by FDA under an Emergency Use Authorization (EUA). This EUA will remain  in effect (meaning this test can be used) for the duration of the COVID-19 declaration under Section  564(b)(1) of the Act, 21 U.S.C.section 360bbb-3(b)(1), unless the authorization is terminated  or revoked sooner.       Influenza A by PCR NEGATIVE NEGATIVE Final   Influenza B by PCR NEGATIVE NEGATIVE Final    Comment: (NOTE) The Xpert Xpress SARS-CoV-2/FLU/RSV plus assay is intended as an aid in the diagnosis of influenza from Nasopharyngeal swab specimens and should not be used as a sole basis for treatment. Nasal washings and aspirates are unacceptable for Xpert Xpress SARS-CoV-2/FLU/RSV testing.  Fact Sheet for Patients: BloggerCourse.com  Fact Sheet for Healthcare Providers: SeriousBroker.it  This test is not yet approved or cleared by the [place] FDA and has been authorized for detection and/or diagnosis of SARS-CoV-2 by FDA under an Emergency Use Authorization (EUA). This EUA will remain in effect (meaning this test can be used) for the duration of the COVID-19 declaration under Section 564(b)(1) of the Act, 21 U.S.C. section 360bbb-3(b)(1), unless the  authorization is terminated or revoked.  Performed at [facility], [address], [place], [place] [address]       Radiology Studies: DG Chest 2 View  Result Date: 04/28/2021 CLINICAL DATA:  Chest pain, shortness of breath, nausea, vomiting, diarrhea, sore throat EXAM: CHEST - 2 VIEW COMPARISON:  Chest radiograph 04/28/2020 FINDINGS: The heart is enlarged, unchanged. The mediastinal contours are within normal limits. There is vascular congestion without overt pulmonary edema. There is no focal consolidation. There is no pleural effusion or pneumothorax. There is no acute osseous abnormality. IMPRESSION: Cardiomegaly. Otherwise, no radiographic evidence of acute cardiopulmonary process. Electronically Signed   By: Lesia Hausen M.D.   On: 04/28/2021 15:38   CT Angio Chest PE W and/or Wo Contrast  Result Date: 04/28/2021 CLINICAL DATA:  Chest pain, shortness of breath with nausea and vomiting. EXAM: CT ANGIOGRAPHY CHEST WITH CONTRAST TECHNIQUE: Multidetector CT imaging of the chest was performed using the standard protocol during bolus administration of intravenous contrast. Multiplanar CT image reconstructions and MIPs were obtained to evaluate the vascular anatomy. CONTRAST:  78mL OMNIPAQUE IOHEXOL 350 MG/ML SOLN COMPARISON:  None. FINDINGS: Cardiovascular: Satisfactory opacification of the pulmonary arteries to the segmental level. No evidence of pulmonary embolism. There is mild cardiomegaly. No pericardial effusion. Mediastinum/Nodes: There is mild AP window and bilateral hilar lymphadenopathy. Thyroid gland, trachea, and esophagus demonstrate no significant findings. Lungs/Pleura: Mild linear atelectasis is seen within the left lung base. There is no evidence of acute infiltrate, pleural effusion or pneumothorax. Upper Abdomen: No acute abnormality. Musculoskeletal: No chest wall abnormality. No acute or significant osseous findings. Review of the MIP images confirms the above  findings. IMPRESSION: 1. No CT evidence of pulmonary embolism or acute cardiopulmonary disease. 2. Mild cardiomegaly. 3. Mild AP window and bilateral hilar lymphadenopathy, likely reactive. Electronically Signed   By: Aram Candela M.D.   On: 04/28/2021 23:17   ECHOCARDIOGRAM COMPLETE  Result Date: 04/29/2021    ECHOCARDIOGRAM REPORT   Patient Name:   Andrew Walters Date of Exam: 04/29/2021 Medical Rec #:  [id number]   Height:       71.0 in Accession #:    [id number]  Weight:       230.0 lb Date of Birth:  [birth year]   BSA:          2.238 m Patient Age:    40 years    BP:           151/110 mmHg Patient Gender: M           HR:  93 bpm. Exam Location:  [facility] Procedure: 2D Echo, Cardiac Doppler and Color Doppler Indications:     CHF-acute diastolic I50.31  History:         Patient has prior history of Echocardiogram examinations, most                  recent 11/22/2020. Risk Factors:Hypertension.  Sonographer:     Cristela Blue Referring Phys:  [id number] AMBER SCOGGINS Diagnosing Phys: Adrian Blackwater IMPRESSIONS  1. Left ventricular ejection fraction, by estimation, is 25 to 30%. The left ventricle has severely decreased function. The left ventricle demonstrates global hypokinesis. The left ventricular internal cavity size was severely dilated. There is mild concentric left ventricular hypertrophy. Left ventricular diastolic parameters are consistent with Grade II diastolic dysfunction (pseudonormalization).  2. Right ventricular systolic function is severely reduced. The right ventricular size is moderately enlarged. Mildly increased right ventricular wall thickness.  3. Left atrial size was severely dilated.  4. Right atrial size was severely dilated.  5. The mitral valve is grossly normal. Mild mitral valve regurgitation.  6. The aortic valve is grossly normal. Aortic valve regurgitation is not visualized. FINDINGS  Left Ventricle: Left ventricular ejection fraction, by estimation, is 25 to 30%. The left  ventricle has severely decreased function. The left ventricle demonstrates global hypokinesis. The left ventricular internal cavity size was severely dilated. There is mild concentric left ventricular hypertrophy. Left ventricular diastolic parameters are consistent with Grade II diastolic dysfunction (pseudonormalization). Right Ventricle: The right ventricular size is moderately enlarged. Mildly increased right ventricular wall thickness. Right ventricular systolic function is severely reduced. Left Atrium: Left atrial size was severely dilated. Right Atrium: Right atrial size was severely dilated. Pericardium: There is no evidence of pericardial effusion. Mitral Valve: The mitral valve is grossly normal. Mild mitral valve regurgitation. Tricuspid Valve: The tricuspid valve is grossly normal. Tricuspid valve regurgitation is mild. Aortic Valve: The aortic valve is grossly normal. Aortic valve regurgitation is not visualized. Aortic valve mean gradient measures 3.0 mmHg. Aortic valve peak gradient measures 5.6 mmHg. Aortic valve area, by VTI measures 2.44 cm. Pulmonic Valve: The pulmonic valve was grossly normal. Pulmonic valve regurgitation is trivial. Aorta: The aortic root, ascending aorta and aortic arch are all structurally normal, with no evidence of dilitation or obstruction. IAS/Shunts: No atrial level shunt detected by color flow Doppler. There is no evidence of a patent foramen ovale. There is no evidence of an atrial septal defect.  LEFT VENTRICLE PLAX 2D LVIDd:         6.50 cm      Diastology LVIDs:         5.40 cm      LV e' medial:    5.22 cm/s LV PW:         1.30 cm      LV E/e' medial:  16.3 LV IVS:        1.05 cm      LV e' lateral:   5.98 cm/s LVOT diam:     2.20 cm      LV E/e' lateral: 14.3 LV SV:         49 LV SV Index:   22 LVOT Area:     3.80 cm  LV Volumes (MOD) LV vol d, MOD A2C: 138.0 ml LV vol d, MOD A4C: 152.0 ml LV vol s, MOD A2C: 101.0 ml LV vol s, MOD A4C: 92.6 ml LV SV MOD A2C:      37.0 ml LV SV MOD  A4C:     152.0 ml LV SV MOD BP:      53.8 ml RIGHT VENTRICLE RV Basal diam:  4.20 cm RV S prime:     11.00 cm/s TAPSE (M-mode): 4.5 cm LEFT ATRIUM              Index       RIGHT ATRIUM           Index LA diam:        4.20 cm  1.88 cm/m  RA Area:     24.30 cm LA Vol (A2C):   121.0 ml 54.08 ml/m RA Volume:   75.90 ml  33.92 ml/m LA Vol (A4C):   100.0 ml 44.69 ml/m LA Biplane Vol: 113.0 ml 50.50 ml/m  AORTIC VALVE                   PULMONIC VALVE AV Area (Vmax):    2.12 cm    PV Vmax:        0.54 m/s AV Area (Vmean):   2.03 cm    PV Peak grad:   1.2 mmHg AV Area (VTI):     2.44 cm    RVOT Peak grad: 2 mmHg AV Vmax:           118.00 cm/s AV Vmean:          86.500 cm/s AV VTI:            0.201 m AV Peak Grad:      5.6 mmHg AV Mean Grad:      3.0 mmHg LVOT Vmax:         65.90 cm/s LVOT Vmean:        46.200 cm/s LVOT VTI:          0.129 m LVOT/AV VTI ratio: 0.64  AORTA Ao Root diam: 3.17 cm MITRAL VALVE               TRICUSPID VALVE MV Area (PHT): 5.09 cm    TR Peak grad:   22.3 mmHg MV Decel Time: 149 msec    TR Vmax:        236.00 cm/s MV E velocity: 85.30 cm/s MV A velocity: 37.30 cm/s  SHUNTS MV E/A ratio:  2.29        Systemic VTI:  0.13 m                            Systemic Diam: 2.20 cm [employer] Electronically signed by Adrian Blackwater Signature Date/Time: 04/29/2021/1:13:03 PM    Final      Scheduled Meds:  enoxaparin (LOVENOX) injection  0.5 mg/kg Subcutaneous Q24H   metoprolol succinate  25 mg Oral Daily   pantoprazole (PROTONIX) IV  40 mg Intravenous Q24H   [START ON 04/30/2021] sacubitril-valsartan  1 tablet Oral BID   sodium chloride flush  3 mL Intravenous Q12H   Continuous Infusions:  sodium chloride       LOS: 0 days    Darlin Priestly, MD [facility] If 7PM-7AM, please contact night-coverage 04/29/2021, 11:52 PM

## 2021-04-29 NOTE — H&P (Signed)
History and Physical    Andrew Walters ZOX:[id number] DOB: September 13, 1980 DOA: 04/28/2021  PCP: Patient, [name] (Inactive)   Patient coming from: home  I have personally briefly reviewed patient's old medical records in [facility]  Chief Complaint: chest pain, nausea vomiting  HPI: Andrew Walters is a 40 y.o. male with medical history significant for Hypertension not currently on antihypertensives and tobacco and cannabis use who presents to the ED with a 1 day history of chest pain, cough and shortness of breath associated with nonbloody nonbilious vomiting as well as some nonbloody diarrhea.  He denies fever or chills.  Describes chest pain as a tightness in his anterior chest that comes and goes and not exertional, not radiating.  Denies recent cocaine use cough is nonproductive.  ED course: BP in the ED was as high as 178/125 with otherwise normal vitals Blood work: Troponin 65/56 with BNP 396 Lipase 58.  LFTs normal except for total bili of 1.4 Creatinine 1.68 with potassium 3.4 CBC WNL Urinalysis unremarkable, COVID and flu negative  EKG, personally viewed and interpreted: Sinus rhythm with occasional PVCs, prolonged QTC but no acute ST-T wave changes  Imaging: CTA PE protocol with no evidence of PE, mild cardiomegaly Chest x-ray vascular congestion without overt pulmonary edema  Patient was treated with chewable aspirin sublingual nitro, IV fluid bolus.  Hospitalist consulted for admission.  Review of Systems: As per HPI otherwise all other systems on review of systems negative.    Past Medical History:  Diagnosis Date   Hypertension     History reviewed. No pertinent surgical history.   reports that he has been smoking. He has never used smokeless tobacco. He reports that he does not drink alcohol and does not use drugs.  No Known Allergies  History reviewed. No pertinent family history.    Prior to Admission medications   Medication Sig Start Date End Date  Taking? Authorizing Provider  hydrochlorothiazide (HYDRODIURIL) 25 MG tablet Take 1 tablet (25 mg total) by mouth daily. 04/28/20   Tommi Rumps, PA-C  lisinopril (ZESTRIL) 20 MG tablet Take 1 tablet (20 mg total) by mouth daily. 04/28/20   Tommi Rumps, PA-C  ondansetron (ZOFRAN ODT) 4 MG disintegrating tablet Take 1 tablet (4 mg total) by mouth every 8 (eight) hours as needed for nausea or vomiting. 04/28/20   Tommi Rumps, PA-C    Physical Exam: Vitals:   04/28/21 2045 04/28/21 2100 04/28/21 2344 04/29/21 0018  BP:  (!) 150/107 (!) 153/100 (!) 146/116  Pulse: 80 83  62  Resp: (!) 22 18 18 16   Temp:  98 F (36.7 C) 98 F (36.7 C) 97.8 F (36.6 C)  TempSrc:  Oral Oral Oral  SpO2: 100% 100% 97% 96%     Vitals:   04/28/21 2045 04/28/21 2100 04/28/21 2344 04/29/21 0018  BP:  (!) 150/107 (!) 153/100 (!) 146/116  Pulse: 80 83  62  Resp: (!) 22 18 18 16   Temp:  98 F (36.7 C) 98 F (36.7 C) 97.8 F (36.6 C)  TempSrc:  Oral Oral Oral  SpO2: 100% 100% 97% 96%      Constitutional: Alert and oriented x 3 . Not in any apparent distress HEENT:      Head: Normocephalic and atraumatic.         Eyes: PERLA, EOMI, Conjunctivae are normal. Sclera is non-icteric.       Mouth/Throat: Mucous membranes are moist.       Neck:  Supple with no signs of meningismus. Cardiovascular: Regular rate and rhythm. No murmurs, gallops, or rubs. 2+ symmetrical distal pulses are present . No JVD. No LE edema Respiratory: Respiratory effort normal .few bibasilar rales Gastrointestinal: Soft, non tender, and non distended with positive bowel sounds.  Genitourinary: No CVA tenderness. Musculoskeletal: Nontender with normal range of motion in all extremities. No cyanosis, or erythema of extremities. Neurologic:  Face is symmetric. Moving all extremities. No gross focal neurologic deficits . Skin: Skin is warm, dry.  No rash or ulcers Psychiatric: Mood and affect are normal    Labs on  Admission: I have personally reviewed following labs and imaging studies  CBC: Recent Labs  Lab 04/28/21 1457  WBC 6.4  HGB 16.2  HCT 47.1  MCV 92.4  PLT 270   Basic Metabolic Panel: Recent Labs  Lab 04/28/21 1457 04/28/21 1900  NA 139  --   K 3.4*  --   CL 103  --   CO2 30  --   GLUCOSE 92  --   BUN 12  --   CREATININE 1.68*  --   CALCIUM 8.9  --   MG  --  2.1   GFR: CrCl cannot be calculated (Unknown ideal weight.). Liver Function Tests: Recent Labs  Lab 04/28/21 1900  AST 23  ALT 24  ALKPHOS 54  BILITOT 1.4*  PROT 7.4  ALBUMIN 4.1   Recent Labs  Lab 04/28/21 1900  LIPASE 58*   No results for input(s): AMMONIA in the last 168 hours. Coagulation Profile: No results for input(s): INR, PROTIME in the last 168 hours. Cardiac Enzymes: No results for input(s): CKTOTAL, CKMB, CKMBINDEX, TROPONINI in the last 168 hours. BNP (last 3 results) No results for input(s): PROBNP in the last 8760 hours. HbA1C: No results for input(s): HGBA1C in the last 72 hours. CBG: No results for input(s): GLUCAP in the last 168 hours. Lipid Profile: No results for input(s): CHOL, HDL, LDLCALC, TRIG, CHOLHDL, LDLDIRECT in the last 72 hours. Thyroid Function Tests: No results for input(s): TSH, T4TOTAL, FREET4, T3FREE, THYROIDAB in the last 72 hours. Anemia Panel: No results for input(s): VITAMINB12, FOLATE, FERRITIN, TIBC, IRON, RETICCTPCT in the last 72 hours. Urine analysis:    Component Value Date/Time   COLORURINE YELLOW (A) 04/28/2021 2115   APPEARANCEUR CLEAR (A) 04/28/2021 2115   LABSPEC 1.024 04/28/2021 2115   PHURINE 5.0 04/28/2021 2115   GLUCOSEU NEGATIVE 04/28/2021 2115   HGBUR SMALL (A) 04/28/2021 2115   BILIRUBINUR NEGATIVE 04/28/2021 2115   KETONESUR NEGATIVE 04/28/2021 2115   PROTEINUR 100 (A) 04/28/2021 2115   NITRITE NEGATIVE 04/28/2021 2115   LEUKOCYTESUR TRACE (A) 04/28/2021 2115    Radiological Exams on Admission: DG Chest 2 View  Result Date:  04/28/2021 CLINICAL DATA:  Chest pain, shortness of breath, nausea, vomiting, diarrhea, sore throat EXAM: CHEST - 2 VIEW COMPARISON:  Chest radiograph 04/28/2020 FINDINGS: The heart is enlarged, unchanged. The mediastinal contours are within normal limits. There is vascular congestion without overt pulmonary edema. There is no focal consolidation. There is no pleural effusion or pneumothorax. There is no acute osseous abnormality. IMPRESSION: Cardiomegaly. Otherwise, no radiographic evidence of acute cardiopulmonary process. Electronically Signed   By: Lesia Hausen M.D.   On: 04/28/2021 15:38   CT Angio Chest PE W and/or Wo Contrast  Result Date: 04/28/2021 CLINICAL DATA:  Chest pain, shortness of breath with nausea and vomiting. EXAM: CT ANGIOGRAPHY CHEST WITH CONTRAST TECHNIQUE: Multidetector CT imaging of the chest was performed  using the standard protocol during bolus administration of intravenous contrast. Multiplanar CT image reconstructions and MIPs were obtained to evaluate the vascular anatomy. CONTRAST:  47mL OMNIPAQUE IOHEXOL 350 MG/ML SOLN COMPARISON:  None. FINDINGS: Cardiovascular: Satisfactory opacification of the pulmonary arteries to the segmental level. No evidence of pulmonary embolism. There is mild cardiomegaly. No pericardial effusion. Mediastinum/Nodes: There is mild AP window and bilateral hilar lymphadenopathy. Thyroid gland, trachea, and esophagus demonstrate no significant findings. Lungs/Pleura: Mild linear atelectasis is seen within the left lung base. There is no evidence of acute infiltrate, pleural effusion or pneumothorax. Upper Abdomen: No acute abnormality. Musculoskeletal: No chest wall abnormality. No acute or significant osseous findings. Review of the MIP images confirms the above findings. IMPRESSION: 1. No CT evidence of pulmonary embolism or acute cardiopulmonary disease. 2. Mild cardiomegaly. 3. Mild AP window and bilateral hilar lymphadenopathy, likely reactive.  Electronically Signed   By: Aram Candela M.D.   On: 04/28/2021 23:17     Assessment/Plan 40 year old male with history of hypertension not on medication, presenting with chest pain, nausea and vomiting      Chest pain with elevated troponin - Suspect related to hypertensive urgency/emergency as well as vomiting - CTA chest negative for PE - Suspect demand ischemia.  EKG nonacute and troponin downtrending from 65-56 - Received chewable aspirin in the ED - BP control - Echo to evaluate for wall motion abnormality  Hypertensive urgency  Mild acute CHF - BP as high as 178/125 in the ED - BNP 396 and chest x-ray showed pulmonary vascular congestion without output of edema - BP control, one-time Lasix then as needed -Daily weights - Echocardiogram in the a.m. - Cardiology consult    AKI (acute kidney injury) (HCC)   Nausea and vomiting - Creatinine 1.68 likely prerenal secondary to dehydration from nausea and vomiting - Received gentle IV fluid bolus.  Will hydrate IV as tolerated in the setting of mild acute CHF  Nausea, vomiting, cough - Possible viral illness, possibly related to hypertensive emergency -Clear liquids and advance as tolerated - Antiemetics, IV Protonix - Received a gentle bolus in the ED     DVT prophylaxis: Lovenox  Code Status: full code  Family Communication:  none  Disposition Plan: Back to previous home environment Consults called:  cardiology Status:observation    Andris Baumann MD [facility]     04/29/2021, 12:41 AM

## 2021-04-29 NOTE — Progress Notes (Signed)
Patient c/o 8 out of 10 generalized body aches/pain requesting something stronger than tylenol for it. MD notified, no new orders for pain medication at this time, order for tylenol modified.

## 2021-04-29 NOTE — ED Notes (Signed)
Patient ambulatory to BR independently. Reports having an episode of diarrhea and dizziness upon returning to stretcher.

## 2021-04-30 ENCOUNTER — Encounter: Payer: Self-pay | Admitting: Internal Medicine

## 2021-04-30 DIAGNOSIS — I429 Cardiomyopathy, unspecified: Secondary | ICD-10-CM

## 2021-04-30 LAB — CBC
HCT: 46.6 % (ref 39.0–52.0)
Hemoglobin: 15.9 g/dL (ref 13.0–17.0)
MCH: 31.7 pg (ref 26.0–34.0)
MCHC: 34.1 g/dL (ref 30.0–36.0)
MCV: 93 fL (ref 80.0–100.0)
Platelets: 259 10*3/uL (ref 150–400)
RBC: 5.01 MIL/uL (ref 4.22–5.81)
RDW: 13.7 % (ref 11.5–15.5)
WBC: 5.6 10*3/uL (ref 4.0–10.5)
nRBC: 0 % (ref 0.0–0.2)

## 2021-04-30 LAB — MAGNESIUM: Magnesium: 2 mg/dL (ref 1.7–2.4)

## 2021-04-30 LAB — BASIC METABOLIC PANEL
Anion gap: 8 (ref 5–15)
BUN: 18 mg/dL (ref 6–20)
CO2: 27 mmol/L (ref 22–32)
Calcium: 9 mg/dL (ref 8.9–10.3)
Chloride: 103 mmol/L (ref 98–111)
Creatinine, Ser: 1.42 mg/dL — ABNORMAL HIGH (ref 0.61–1.24)
GFR, Estimated: >60 mL/min (ref >60)
Glucose, Bld: 112 mg/dL — ABNORMAL HIGH (ref 70–99)
Potassium: 3.6 mmol/L (ref 3.5–5.1)
Sodium: 138 mmol/L (ref 135–145)

## 2021-04-30 MED ORDER — HYDRALAZINE HCL 20 MG/ML IJ SOLN
10.0000 mg | Freq: Once | INTRAMUSCULAR | Status: AC
  Administered 2021-04-30: 10 mg via INTRAVENOUS
  Filled 2021-04-30: qty 1

## 2021-04-30 MED ORDER — HYDRALAZINE HCL 20 MG/ML IJ SOLN: 10.0000 mg | Freq: Four times a day (QID) | INTRAMUSCULAR | Status: DC | PRN

## 2021-04-30 MED ORDER — PANTOPRAZOLE SODIUM 40 MG PO TBEC
40.0000 mg | DELAYED_RELEASE_TABLET | Freq: Every day | ORAL | Status: DC
  Administered 2021-04-30 – 2021-05-02 (×3): 40 mg via ORAL
  Filled 2021-04-30 (×3): qty 1

## 2021-04-30 NOTE — Progress Notes (Signed)
SUBJECTIVE: Andrew Walters is a 40 y.o. male with medical history significant for Hypertension not currently on antihypertensives and tobacco and cannabis use who presents to the ED with a 1 day history of chest pain, cough and shortness of breath associated with nonbloody nonbilious vomiting as well as some nonbloody diarrhea.  He denied fever or chills.  Described chest pain as a tightness in his anterior chest that comes and goes and not exertional, not radiating.   Vitals:   04/29/21 1923 04/30/21 0014 04/30/21 0433 04/30/21 0748  BP: 136/84 (!) 145/82 (!) 145/91 (!) 150/98  Pulse: 79 73 69 72  Resp: 20 18 18 19   Temp: 98 F (36.7 C) 98.8 F (37.1 C) 98.4 F (36.9 C) 98 F (36.7 C)  TempSrc: Oral Oral Oral   SpO2: 97% 97% 99% 97%  Weight:      Height:        Intake/Output Summary (Last 24 hours) at 04/30/2021 1017 Last data filed at 04/29/2021 1848 Gross per 24 hour  Intake 600 ml  Output 1900 ml  Net -1300 ml    LABS: Basic Metabolic Panel: Recent Labs    04/28/21 1457 04/28/21 1900 04/29/21 0451 04/30/21 0536  NA 139  --   --  138  K 3.4*  --   --  3.6  CL 103  --   --  103  CO2 30  --   --  27  GLUCOSE 92  --   --  112*  BUN 12  --   --  18  CREATININE 1.68*  --  1.41* 1.42*  CALCIUM 8.9  --   --  9.0  MG  --  2.1  --  2.0   Liver Function Tests: Recent Labs    04/28/21 1900  AST 23  ALT 24  ALKPHOS 54  BILITOT 1.4*  PROT 7.4  ALBUMIN 4.1   Recent Labs    04/28/21 1900  LIPASE 58*   CBC: Recent Labs    04/29/21 0451 04/30/21 0536  WBC 6.2 5.6  HGB 15.4 15.9  HCT 45.2 46.6  MCV 94.2 93.0  PLT 245 259   Cardiac Enzymes: No results for input(s): CKTOTAL, CKMB, CKMBINDEX, TROPONINI in the last 72 hours. BNP: Invalid input(s): POCBNP D-Dimer: No results for input(s): DDIMER in the last 72 hours. Hemoglobin A1C: No results for input(s): HGBA1C in the last 72 hours. Fasting Lipid Panel: No results for input(s): CHOL, HDL, LDLCALC, TRIG,  CHOLHDL, LDLDIRECT in the last 72 hours. Thyroid Function Tests: No results for input(s): TSH, T4TOTAL, T3FREE, THYROIDAB in the last 72 hours.  Invalid input(s): FREET3 Anemia Panel: No results for input(s): VITAMINB12, FOLATE, FERRITIN, TIBC, IRON, RETICCTPCT in the last 72 hours.   PHYSICAL EXAM General: Well developed, well nourished, in no acute distress HEENT:  Normocephalic and atramatic Neck:  No JVD.  Lungs: Clear bilaterally to auscultation and percussion. Heart: HRRR . Normal S1 and S2 without gallops or murmurs.  Abdomen: Bowel sounds are positive, abdomen soft and non-tender  Msk:  Back normal, normal gait. Normal strength and tone for age. Extremities: No clubbing, cyanosis or edema.   Neuro: Alert and oriented X 3. Psych:  Good affect, responds appropriately  TELEMETRY: NSR, HR 61 bpm  ASSESSMENT AND PLAN: Patient resting comfortably in bed. Entresto and Toprol Xl started this morning. B/p continues to be elevated. May need to increase Entresto. Will continue to observe.  Active Problems:   AKI (acute kidney injury) (HCC)  Substance abuse (HCC)   Chest pain   Hypertensive urgency   Nausea and vomiting   Acute CHF (congestive heart failure) (HCC)    Evo Aderman, FNP-C 04/30/2021 10:17 AM

## 2021-04-30 NOTE — TOC Progression Note (Signed)
Transition of Care [facility]) - Progression Note    Patient Details  Name: Andrew Walters MRN: [id number] Date of Birth: May 22, 1981  Transition of Care [facility]) CM/SW Contact  Maree Krabbe, LCSW Phone Number: 04/30/2021, 12:43 PM  Clinical Narrative:   Pt uninsured and has no PCP. Referral made to [facility]. MD to send meds to [facility]. CSW called pt's spouse however, no answer and voicemail full.         Expected Discharge Plan and Services                                                 Social Determinants of Health (SDOH) Interventions    Readmission Risk Interventions No flowsheet data found.

## 2021-04-30 NOTE — Progress Notes (Signed)
PROGRESS NOTE    Andrew Walters  FGH:[id number] DOB: Aug 23, 1980 DOA: 04/28/2021 PCP: Patient, [name] (Inactive)    Brief Narrative:  40 y.o. male with medical history significant for Hypertension not currently on antihypertensives and tobacco and cannabis use who presents to the ED with a 1 day history of chest pain, cough and shortness of breath associated with nonbloody nonbilious vomiting as well as some nonbloody diarrhea.  Cardiology consulted.  Recommend medical management.  Titrating goal-directed medical therapy.   Assessment & Plan:   Active Problems:   AKI (acute kidney injury) (HCC)   Substance abuse (HCC)   Chest pain   Hypertensive urgency   Nausea and vomiting   Acute CHF (congestive heart failure) (HCC)  Chest pain, resolved elevated troponin likely due to demand ischemia Suspect related to hypertensive urgency/emergency as well as vomiting CTA chest negative for PE Suspect demand ischemia.  EKG nonacute and troponin downtrending from 65-56 Cardiology consulted, no plans for ischemic evaluation Plan: Trial of PPI for possible GERD component chest pain   Severe combined CHF, chronic Cardiomyopathy, unclear type --Echo today showed LVEF 25-30%, worsened from 45-50% about 1.5 years ago.  Risk factors include past cocaine use and uncontrolled HTN. --does not appear to be in exacerbation, currently on room air. --Pt currently is not taking any medications at home and does not have a PCP. --Cardiology consulted. --started on Toprol and Entresto  Plan: Continue metoprolol and Entresto Cardiology following, may need up titration of these medications   Hypertensive urgency  - BP as high as 178/125 in the ED --pt was not taking any BP medications at home Plan: --cont Toprol and Entresto (both new), started 10/5     AKI, ruled out - Creatinine 1.68 on presentation, which is around baseline.   Nausea, vomiting, diarrhea - Possible viral illness, or cannabis  use. - Received a gentle bolus in the ED --supportive care --Soft diet as tolerated   Hx of cocaine use --Pt reported quitting about 7 months ago.  UDS today neg for cocaine. --continued cessation encouraged due to cardiomyopathy    DVT prophylaxis: SQ Lovenox Code Status: Full Family Communication: None today Disposition Plan: Status is: Observation  The patient will require care spanning > 2 midnights and should be moved to inpatient because: Inpatient level of care appropriate due to severity of illness  Dispo: The patient is from: Home              Anticipated d/c is to: Home              Patient currently is not medically stable to d/c.   Difficult to place patient No       Level of care: Progressive Cardiac  Consultants:  Cardiology  Procedures:  None  Antimicrobials: None   Subjective: Patient seen and examined.  Endorses intermittent chest pain.  Objective: Vitals:   04/30/21 0433 04/30/21 0748 04/30/21 1119 04/30/21 1120  BP: (!) 145/91 (!) 150/98 (!) 158/109 (!) 157/99  Pulse: 69 72 89   Resp: 18 19 20    Temp: 98.4 F (36.9 C) 98 F (36.7 C) 98.5 F (36.9 C)   TempSrc: Oral  Oral   SpO2: 99% 97% 98%   Weight:      Height:        Intake/Output Summary (Last 24 hours) at 04/30/2021 1436 Last data filed at 04/30/2021 1402 Gross per 24 hour  Intake 1560 ml  Output 2250 ml  Net -690 ml  Filed Weights   04/29/21 0137  Weight: 104.3 kg    Examination:  General exam: Appears calm and comfortable  Respiratory system: Clear to auscultation. Respiratory effort normal. Cardiovascular system: S1 & S2 heard, RRR. No JVD, murmurs, rubs, gallops or clicks. No pedal edema. Gastrointestinal system: Abdomen is nondistended, soft and nontender. No organomegaly or masses felt. Normal bowel sounds heard. Central nervous system: Alert and oriented. No focal neurological deficits. Extremities: Symmetric 5 x 5 power. Skin: No rashes, lesions or  ulcers Psychiatry: Judgement and insight appear normal. Mood & affect appropriate.     Data Reviewed: I have personally reviewed following labs and imaging studies  CBC: Recent Labs  Lab 04/28/21 1457 04/29/21 0451 04/30/21 0536  WBC 6.4 6.2 5.6  HGB 16.2 15.4 15.9  HCT 47.1 45.2 46.6  MCV 92.4 94.2 93.0  PLT 270 245 259   Basic Metabolic Panel: Recent Labs  Lab 04/28/21 1457 04/28/21 1900 04/29/21 0451 04/30/21 0536  NA 139  --   --  138  K 3.4*  --   --  3.6  CL 103  --   --  103  CO2 30  --   --  27  GLUCOSE 92  --   --  112*  BUN 12  --   --  18  CREATININE 1.68*  --  1.41* 1.42*  CALCIUM 8.9  --   --  9.0  MG  --  2.1  --  2.0   GFR: Estimated Creatinine Clearance: 84.1 mL/min (A) (by C-G formula based on SCr of 1.42 mg/dL (H)). Liver Function Tests: Recent Labs  Lab 04/28/21 1900  AST 23  ALT 24  ALKPHOS 54  BILITOT 1.4*  PROT 7.4  ALBUMIN 4.1   Recent Labs  Lab 04/28/21 1900  LIPASE 58*   No results for input(s): AMMONIA in the last 168 hours. Coagulation Profile: No results for input(s): INR, PROTIME in the last 168 hours. Cardiac Enzymes: No results for input(s): CKTOTAL, CKMB, CKMBINDEX, TROPONINI in the last 168 hours. BNP (last 3 results) No results for input(s): PROBNP in the last 8760 hours. HbA1C: No results for input(s): HGBA1C in the last 72 hours. CBG: No results for input(s): GLUCAP in the last 168 hours. Lipid Profile: No results for input(s): CHOL, HDL, LDLCALC, TRIG, CHOLHDL, LDLDIRECT in the last 72 hours. Thyroid Function Tests: No results for input(s): TSH, T4TOTAL, FREET4, T3FREE, THYROIDAB in the last 72 hours. Anemia Panel: No results for input(s): VITAMINB12, FOLATE, FERRITIN, TIBC, IRON, RETICCTPCT in the last 72 hours. Sepsis Labs: No results for input(s): PROCALCITON, LATICACIDVEN in the last 168 hours.  Recent Results (from the past 240 hour(s))  Resp Panel by RT-PCR (Flu A&B, Covid) Nasopharyngeal Swab      Status: None   Collection Time: 04/28/21  9:24 PM   Specimen: Nasopharyngeal Swab; Nasopharyngeal(NP) swabs in vial transport medium  Result Value Ref Range Status   SARS Coronavirus 2 by RT PCR NEGATIVE NEGATIVE Final    Comment: (NOTE) SARS-CoV-2 target nucleic acids are NOT DETECTED.  The SARS-CoV-2 RNA is generally detectable in upper respiratory specimens during the acute phase of infection. The lowest concentration of SARS-CoV-2 viral copies this assay can detect is 138 copies/mL. A negative result does not preclude SARS-Cov-2 infection and should not be used as the sole basis for treatment or other patient management decisions. A negative result may occur with  improper specimen collection/handling, submission of specimen other than nasopharyngeal swab, presence of viral mutation(s)  within the areas targeted by this assay, and inadequate number of viral copies(<138 copies/mL). A negative result must be combined with clinical observations, patient history, and epidemiological information. The expected result is Negative.  Fact Sheet for Patients:  BloggerCourse.com  Fact Sheet for Healthcare Providers:  SeriousBroker.it  This test is no t yet approved or cleared by the [place] FDA and  has been authorized for detection and/or diagnosis of SARS-CoV-2 by FDA under an Emergency Use Authorization (EUA). This EUA will remain  in effect (meaning this test can be used) for the duration of the COVID-19 declaration under Section 564(b)(1) of the Act, 21 U.S.C.section 360bbb-3(b)(1), unless the authorization is terminated  or revoked sooner.       Influenza A by PCR NEGATIVE NEGATIVE Final   Influenza B by PCR NEGATIVE NEGATIVE Final    Comment: (NOTE) The Xpert Xpress SARS-CoV-2/FLU/RSV plus assay is intended as an aid in the diagnosis of influenza from Nasopharyngeal swab specimens and should not be used as a sole basis  for treatment. Nasal washings and aspirates are unacceptable for Xpert Xpress SARS-CoV-2/FLU/RSV testing.  Fact Sheet for Patients: BloggerCourse.com  Fact Sheet for Healthcare Providers: SeriousBroker.it  This test is not yet approved or cleared by the [place] FDA and has been authorized for detection and/or diagnosis of SARS-CoV-2 by FDA under an Emergency Use Authorization (EUA). This EUA will remain in effect (meaning this test can be used) for the duration of the COVID-19 declaration under Section 564(b)(1) of the Act, 21 U.S.C. section 360bbb-3(b)(1), unless the authorization is terminated or revoked.  Performed at [facility], [address] [place], [place] [address]          Radiology Studies: DG Chest 2 View  Result Date: 04/28/2021 CLINICAL DATA:  Chest pain, shortness of breath, nausea, vomiting, diarrhea, sore throat EXAM: CHEST - 2 VIEW COMPARISON:  Chest radiograph 04/28/2020 FINDINGS: The heart is enlarged, unchanged. The mediastinal contours are within normal limits. There is vascular congestion without overt pulmonary edema. There is no focal consolidation. There is no pleural effusion or pneumothorax. There is no acute osseous abnormality. IMPRESSION: Cardiomegaly. Otherwise, no radiographic evidence of acute cardiopulmonary process. Electronically Signed   By: Lesia Hausen M.D.   On: 04/28/2021 15:38   CT Angio Chest PE W and/or Wo Contrast  Result Date: 04/28/2021 CLINICAL DATA:  Chest pain, shortness of breath with nausea and vomiting. EXAM: CT ANGIOGRAPHY CHEST WITH CONTRAST TECHNIQUE: Multidetector CT imaging of the chest was performed using the standard protocol during bolus administration of intravenous contrast. Multiplanar CT image reconstructions and MIPs were obtained to evaluate the vascular anatomy. CONTRAST:  75mL OMNIPAQUE IOHEXOL 350 MG/ML SOLN COMPARISON:  None. FINDINGS:  Cardiovascular: Satisfactory opacification of the pulmonary arteries to the segmental level. No evidence of pulmonary embolism. There is mild cardiomegaly. No pericardial effusion. Mediastinum/Nodes: There is mild AP window and bilateral hilar lymphadenopathy. Thyroid gland, trachea, and esophagus demonstrate no significant findings. Lungs/Pleura: Mild linear atelectasis is seen within the left lung base. There is no evidence of acute infiltrate, pleural effusion or pneumothorax. Upper Abdomen: No acute abnormality. Musculoskeletal: No chest wall abnormality. No acute or significant osseous findings. Review of the MIP images confirms the above findings. IMPRESSION: 1. No CT evidence of pulmonary embolism or acute cardiopulmonary disease. 2. Mild cardiomegaly. 3. Mild AP window and bilateral hilar lymphadenopathy, likely reactive. Electronically Signed   By: Aram Candela M.D.   On: 04/28/2021 23:17   ECHOCARDIOGRAM COMPLETE  Result  Date: 04/29/2021    ECHOCARDIOGRAM REPORT   Patient Name:   NOBEL BRAR Date of Exam: 04/29/2021 Medical Rec #:  [id number]   Height:       71.0 in Accession #:    [id number]  Weight:       230.0 lb Date of Birth:  Mar 22, 1981   BSA:          2.238 m Patient Age:    40 years    BP:           151/110 mmHg Patient Gender: M           HR:           93 bpm. Exam Location:  [facility] Procedure: 2D Echo, Cardiac Doppler and Color Doppler Indications:     CHF-acute diastolic I50.31  History:         Patient has prior history of Echocardiogram examinations, most                  recent 11/22/2020. Risk Factors:Hypertension.  Sonographer:     Cristela Blue Referring Phys:  [id number] AMBER SCOGGINS Diagnosing Phys: Adrian Blackwater IMPRESSIONS  1. Left ventricular ejection fraction, by estimation, is 25 to 30%. The left ventricle has severely decreased function. The left ventricle demonstrates global hypokinesis. The left ventricular internal cavity size was severely dilated. There is mild concentric left  ventricular hypertrophy. Left ventricular diastolic parameters are consistent with Grade II diastolic dysfunction (pseudonormalization).  2. Right ventricular systolic function is severely reduced. The right ventricular size is moderately enlarged. Mildly increased right ventricular wall thickness.  3. Left atrial size was severely dilated.  4. Right atrial size was severely dilated.  5. The mitral valve is grossly normal. Mild mitral valve regurgitation.  6. The aortic valve is grossly normal. Aortic valve regurgitation is not visualized. FINDINGS  Left Ventricle: Left ventricular ejection fraction, by estimation, is 25 to 30%. The left ventricle has severely decreased function. The left ventricle demonstrates global hypokinesis. The left ventricular internal cavity size was severely dilated. There is mild concentric left ventricular hypertrophy. Left ventricular diastolic parameters are consistent with Grade II diastolic dysfunction (pseudonormalization). Right Ventricle: The right ventricular size is moderately enlarged. Mildly increased right ventricular wall thickness. Right ventricular systolic function is severely reduced. Left Atrium: Left atrial size was severely dilated. Right Atrium: Right atrial size was severely dilated. Pericardium: There is no evidence of pericardial effusion. Mitral Valve: The mitral valve is grossly normal. Mild mitral valve regurgitation. Tricuspid Valve: The tricuspid valve is grossly normal. Tricuspid valve regurgitation is mild. Aortic Valve: The aortic valve is grossly normal. Aortic valve regurgitation is not visualized. Aortic valve mean gradient measures 3.0 mmHg. Aortic valve peak gradient measures 5.6 mmHg. Aortic valve area, by VTI measures 2.44 cm. Pulmonic Valve: The pulmonic valve was grossly normal. Pulmonic valve regurgitation is trivial. Aorta: The aortic root, ascending aorta and aortic arch are all structurally normal, with no evidence of dilitation or  obstruction. IAS/Shunts: No atrial level shunt detected by color flow Doppler. There is no evidence of a patent foramen ovale. There is no evidence of an atrial septal defect.  LEFT VENTRICLE PLAX 2D LVIDd:         6.50 cm      Diastology LVIDs:         5.40 cm      LV e' medial:    5.22 cm/s LV PW:         1.30 cm  LV E/e' medial:  16.3 LV IVS:        1.05 cm      LV e' lateral:   5.98 cm/s LVOT diam:     2.20 cm      LV E/e' lateral: 14.3 LV SV:         49 LV SV Index:   22 LVOT Area:     3.80 cm  LV Volumes (MOD) LV vol d, MOD A2C: 138.0 ml LV vol d, MOD A4C: 152.0 ml LV vol s, MOD A2C: 101.0 ml LV vol s, MOD A4C: 92.6 ml LV SV MOD A2C:     37.0 ml LV SV MOD A4C:     152.0 ml LV SV MOD BP:      53.8 ml RIGHT VENTRICLE RV Basal diam:  4.20 cm RV S prime:     11.00 cm/s TAPSE (M-mode): 4.5 cm LEFT ATRIUM              Index       RIGHT ATRIUM           Index LA diam:        4.20 cm  1.88 cm/m  RA Area:     24.30 cm LA Vol (A2C):   121.0 ml 54.08 ml/m RA Volume:   75.90 ml  33.92 ml/m LA Vol (A4C):   100.0 ml 44.69 ml/m LA Biplane Vol: 113.0 ml 50.50 ml/m  AORTIC VALVE                   PULMONIC VALVE AV Area (Vmax):    2.12 cm    PV Vmax:        0.54 m/s AV Area (Vmean):   2.03 cm    PV Peak grad:   1.2 mmHg AV Area (VTI):     2.44 cm    RVOT Peak grad: 2 mmHg AV Vmax:           118.00 cm/s AV Vmean:          86.500 cm/s AV VTI:            0.201 m AV Peak Grad:      5.6 mmHg AV Mean Grad:      3.0 mmHg LVOT Vmax:         65.90 cm/s LVOT Vmean:        46.200 cm/s LVOT VTI:          0.129 m LVOT/AV VTI ratio: 0.64  AORTA Ao Root diam: 3.17 cm MITRAL VALVE               TRICUSPID VALVE MV Area (PHT): 5.09 cm    TR Peak grad:   22.3 mmHg MV Decel Time: 149 msec    TR Vmax:        236.00 cm/s MV E velocity: 85.30 cm/s MV A velocity: 37.30 cm/s  SHUNTS MV E/A ratio:  2.29        Systemic VTI:  0.13 m                            Systemic Diam: 2.20 cm [employer] Electronically signed by Adrian Blackwater  Signature Date/Time: 04/29/2021/1:13:03 PM    Final         Scheduled Meds:  enoxaparin (LOVENOX) injection  0.5 mg/kg Subcutaneous Q24H   metoprolol succinate  25 mg Oral Daily   sacubitril-valsartan  1 tablet Oral BID  sodium chloride flush  3 mL Intravenous Q12H   Continuous Infusions:  sodium chloride       LOS: 0 days    Time spent: 25 minutes    Tresa Moore, MD [facility]   If 7PM-7AM, please contact night-coverage  04/30/2021, 2:36 PM

## 2021-04-30 NOTE — Consult Note (Addendum)
   Heart Failure Nurse Navigator Note  HFrEF 25-30%, mild LVH.  Grade 2 diastolic dysfunction.  Severe biatrial enlargement .  EF was reported at 45-50% in 2021  He presented to the emergency room with complaints of chest pain, nausea and vomiting.   Comorbidities:  Hypertension Continued tobacco abuse/cannabis  Previous history of cocaine use having abstained now for 8 months.  Labs:  Sodium 138, potassium 3.6, chloride 103, CO2 27, BUN 18, creatinine 1.42.  BNP 396. Chest x-ray revealed vascular congestion.  Medications:  Metoprolol succinate 25 mg daily Entresto 24/26 mg 1 tablet twice a day  Initial meeting with patient.  He is lying quietly in bed in no acute distress.  Discussed the function of his heart and how it has declined since 2021.    He admits to previous cocaine use but states that he has been clean for the last 8 months.  Also discussed his current tobacco use and it would be best to also discontinue that.  He voices understanding.  He states that he is currently living in with his wife but they are separating.  Talked about how it is going to take care of himself once he goes home.  Went over low-sodium diet, fluid restriction and daily weights.  He states that he does not have a scale at home to weigh himself.  Patient became visibly upset.  Stating I am still a young man.  Discussed if he goes home and takes care of himself with diet, fluid restriction and taking his medications as prescribed that there is a strong possibility that his heart function would return but he has to take this seriously and take care of himself.  He says understanding.  Supplied with scale for daily weights.  He was given a living with heart failure teaching booklet along with low-sodium packet.  He had no further questions, and we will continue to follow along.  Spoke with pharmacy for getting medication management clinic for supplying his meds.   Tresa Endo RN [facility]

## 2021-05-01 ENCOUNTER — Inpatient Hospital Stay: Payer: Self-pay

## 2021-05-01 DIAGNOSIS — I429 Cardiomyopathy, unspecified: Secondary | ICD-10-CM

## 2021-05-01 LAB — BASIC METABOLIC PANEL
Anion gap: 8 (ref 5–15)
BUN: 15 mg/dL (ref 6–20)
CO2: 28 mmol/L (ref 22–32)
Calcium: 8.9 mg/dL (ref 8.9–10.3)
Chloride: 103 mmol/L (ref 98–111)
Creatinine, Ser: 1.21 mg/dL (ref 0.61–1.24)
GFR, Estimated: >60 mL/min (ref >60)
Glucose, Bld: 111 mg/dL — ABNORMAL HIGH (ref 70–99)
Potassium: 3.5 mmol/L (ref 3.5–5.1)
Sodium: 139 mmol/L (ref 135–145)

## 2021-05-01 LAB — CBC
HCT: 48.6 % (ref 39.0–52.0)
Hemoglobin: 16.9 g/dL (ref 13.0–17.0)
MCH: 32.6 pg (ref 26.0–34.0)
MCHC: 34.8 g/dL (ref 30.0–36.0)
MCV: 93.6 fL (ref 80.0–100.0)
Platelets: 263 10*3/uL (ref 150–400)
RBC: 5.19 MIL/uL (ref 4.22–5.81)
RDW: 13.6 % (ref 11.5–15.5)
WBC: 6.3 10*3/uL (ref 4.0–10.5)
nRBC: 0 % (ref 0.0–0.2)

## 2021-05-01 LAB — MAGNESIUM: Magnesium: 2.2 mg/dL (ref 1.7–2.4)

## 2021-05-01 MED ORDER — METOPROLOL SUCCINATE ER 50 MG PO TB24
50.0000 mg | ORAL_TABLET | Freq: Every day | ORAL | Status: DC
  Administered 2021-05-01 – 2021-05-02 (×2): 50 mg via ORAL
  Filled 2021-05-01 (×2): qty 1

## 2021-05-01 MED ORDER — HYDRALAZINE HCL 20 MG/ML IJ SOLN
10.0000 mg | Freq: Four times a day (QID) | INTRAMUSCULAR | Status: DC | PRN
  Administered 2021-05-01: 10 mg via INTRAVENOUS
  Filled 2021-05-01: qty 1

## 2021-05-01 MED ORDER — SODIUM CHLORIDE 0.9 % IV SOLN
12.5000 mg | Freq: Four times a day (QID) | INTRAVENOUS | Status: DC | PRN
  Filled 2021-05-01 (×2): qty 0.5

## 2021-05-01 MED ORDER — METOCLOPRAMIDE HCL 5 MG/ML IJ SOLN
10.0000 mg | Freq: Once | INTRAMUSCULAR | Status: AC
  Administered 2021-05-01: 10 mg via INTRAVENOUS
  Filled 2021-05-01: qty 2

## 2021-05-01 MED ORDER — KETOROLAC TROMETHAMINE 15 MG/ML IJ SOLN
15.0000 mg | Freq: Once | INTRAMUSCULAR | Status: AC
  Administered 2021-05-01: 15 mg via INTRAVENOUS
  Filled 2021-05-01: qty 1

## 2021-05-01 MED ORDER — SACUBITRIL-VALSARTAN 49-51 MG PO TABS
1.0000 | ORAL_TABLET | Freq: Two times a day (BID) | ORAL | Status: DC
  Administered 2021-05-01 – 2021-05-02 (×2): 1 via ORAL
  Filled 2021-05-01 (×2): qty 1

## 2021-05-01 MED ORDER — HYDRALAZINE HCL 20 MG/ML IJ SOLN: 10.0000 mg | Freq: Four times a day (QID) | INTRAMUSCULAR | Status: DC | PRN

## 2021-05-01 MED ORDER — DEXAMETHASONE SODIUM PHOSPHATE 10 MG/ML IJ SOLN
10.0000 mg | Freq: Once | INTRAMUSCULAR | Status: AC
  Administered 2021-05-01: 10 mg via INTRAVENOUS
  Filled 2021-05-01: qty 1

## 2021-05-01 NOTE — Progress Notes (Signed)
SUBJECTIVE: Andrew Walters is a 40 y.o. male with medical history significant for Hypertension not currently on antihypertensives and tobacco and cannabis use who presented to the ED with a 1 day history of chest pain, cough and shortness of breath associated with nonbloody nonbilious vomiting as well as some nonbloody diarrhea.  He denied fever or chills.  Described chest pain as a tightness in his anterior chest that comes and goes and not exertional, not radiating.  Patient complaining of intermittent chest tightness. Reports walking around yesterday, experienced shortness of breath on exertion. Reports vomiting twice this morning. Last meal was last evening.    Vitals:   05/01/21 0500 05/01/21 0510 05/01/21 0615 05/01/21 0810  BP:  (!) 172/126 (!) 150/87 (!) 128/93  Pulse:  82 91 89  Resp:  20  18  Temp:  98.3 F (36.8 C)    TempSrc:      SpO2:  100%  94%  Weight: 104.3 kg     Height:        Intake/Output Summary (Last 24 hours) at 05/01/2021 0845 Last data filed at 04/30/2021 1402 Gross per 24 hour  Intake 960 ml  Output 750 ml  Net 210 ml    LABS: Basic Metabolic Panel: Recent Labs    04/30/21 0536 05/01/21 0552  NA 138 139  K 3.6 3.5  CL 103 103  CO2 27 28  GLUCOSE 112* 111*  BUN 18 15  CREATININE 1.42* 1.21  CALCIUM 9.0 8.9  MG 2.0 2.2   Liver Function Tests: Recent Labs    04/28/21 1900  AST 23  ALT 24  ALKPHOS 54  BILITOT 1.4*  PROT 7.4  ALBUMIN 4.1   Recent Labs    04/28/21 1900  LIPASE 58*   CBC: Recent Labs    04/30/21 0536 05/01/21 0552  WBC 5.6 6.3  HGB 15.9 16.9  HCT 46.6 48.6  MCV 93.0 93.6  PLT 259 263   Cardiac Enzymes: No results for input(s): CKTOTAL, CKMB, CKMBINDEX, TROPONINI in the last 72 hours. BNP: Invalid input(s): POCBNP D-Dimer: No results for input(s): DDIMER in the last 72 hours. Hemoglobin A1C: No results for input(s): HGBA1C in the last 72 hours. Fasting Lipid Panel: No results for input(s): CHOL, HDL, LDLCALC,  TRIG, CHOLHDL, LDLDIRECT in the last 72 hours. Thyroid Function Tests: No results for input(s): TSH, T4TOTAL, T3FREE, THYROIDAB in the last 72 hours.  Invalid input(s): FREET3 Anemia Panel: No results for input(s): VITAMINB12, FOLATE, FERRITIN, TIBC, IRON, RETICCTPCT in the last 72 hours.   PHYSICAL EXAM General: Well developed, well nourished, in no acute distress HEENT:  Normocephalic and atramatic Neck:  No JVD.  Lungs: Clear bilaterally to auscultation and percussion. Heart: HRRR . Normal S1 and S2 without gallops or murmurs.  Abdomen: Bowel sounds are positive, abdomen soft and non-tender  Msk:  Back normal, normal gait. Normal strength and tone for age. Extremities: No clubbing, cyanosis or edema.   Neuro: Alert and oriented X 3. Psych:  Good affect, responds appropriately  TELEMETRY: NSR, HR 82  ASSESSMENT AND PLAN: Patient's b/p continues to be elevated. Increase Entresto. No other changes at this time. Will continue to observe.  Active Problems:   AKI (acute kidney injury) (HCC)   Substance abuse (HCC)   Chest pain   Hypertensive urgency   Nausea and vomiting   Acute CHF (congestive heart failure) (HCC)   Cardiomyopathy (HCC)    Jatoya Armbrister, FNP-C 05/01/2021 8:45 AM

## 2021-05-01 NOTE — Progress Notes (Signed)
PROGRESS NOTE    Heinrich Fertig  XBJ:[id number] DOB: [birth year] DOA: 04/28/2021 PCP: Patient, [name] (Inactive)    Brief Narrative:  40 y.o. male with medical history significant for Hypertension not currently on antihypertensives and tobacco and cannabis use who presents to the ED with a 1 day history of chest pain, cough and shortness of breath associated with nonbloody nonbilious vomiting as well as some nonbloody diarrhea.  Cardiology consulted.  Recommend medical management.  Titrating goal-directed medical therapy.  On 10/6 patient reported intractable nausea and vomiting and severe headache.   Assessment & Plan:   Active Problems:   AKI (acute kidney injury) (HCC)   Substance abuse (HCC)   Chest pain   Hypertensive urgency   Nausea and vomiting   Acute CHF (congestive heart failure) (HCC)   Cardiomyopathy (HCC)  Chest pain, resolved elevated troponin likely due to demand ischemia Suspect related to hypertensive urgency/emergency as well as vomiting CTA chest negative for PE Suspect demand ischemia.  EKG nonacute and troponin downtrending from 65-56 Cardiology consulted, no plans for ischemic evaluation Plan: Continue PPI Antiemetics as needed   Severe combined CHF, chronic Cardiomyopathy, unclear type --Echo today showed LVEF 25-30%, worsened from 45-50% about 1.5 years ago.  Risk factors include past cocaine use and uncontrolled HTN. --does not appear to be in exacerbation, currently on room air. --Pt currently is not taking any medications at home and does not have a PCP. --Cardiology consulted. --started on Toprol and Entresto  Plan: Continue metoprolol and Entresto Dose of metoprolol increased to 50 mg daily For cardiology regarding titration of Entresto dose   Hypertensive urgency  - BP as high as 178/125 in the ED --pt was not taking any BP medications at home Plan: --cont Toprol and Entresto (both new), started 10/5     AKI, ruled out - Creatinine  1.68 on presentation, which is around baseline.   Nausea, vomiting, diarrhea - Possible viral illness, or cannabis use. - Received a gentle bolus in the ED --supportive care -- Heart healthy diet as tolerated   Hx of cocaine use --Pt reported quitting about 7 months ago.  UDS neg for cocaine. --continued cessation encouraged due to cardiomyopathy    DVT prophylaxis: SQ Lovenox Code Status: Full Family Communication: None today Disposition Plan: Status is: Inpatient  Remains inpatient appropriate because:Inpatient level of care appropriate due to severity of illness  Dispo: The patient is from: Home              Anticipated d/c is to: Home              Patient currently is not medically stable to d/c.   Difficult to place patient No           Level of care: Progressive Cardiac  Consultants:  Cardiology  Procedures:  None  Antimicrobials: None   Subjective: Patient seen and examined.  Nauseous this morning.  Unable to tolerate p.o.  Objective: Vitals:   05/01/21 0510 05/01/21 0615 05/01/21 0810 05/01/21 1211  BP: (!) 172/126 (!) 150/87 (!) 128/93 (!) 161/96  Pulse: 82 91 89 62  Resp: 20  18 18   Temp: 98.3 F (36.8 C)     TempSrc:      SpO2: 100%  94% 97%  Weight:      Height:        Intake/Output Summary (Last 24 hours) at 05/01/2021 1314 Last data filed at 04/30/2021 1402 Gross per 24 hour  Intake 240 ml  Output --  Net 240 ml   Filed Weights   04/29/21 0137 05/01/21 0057 05/01/21 0500  Weight: 104.3 kg 104.3 kg 104.3 kg    Examination:  General exam: Appears fatigued Respiratory system: Clear to auscultation. Respiratory effort normal. Cardiovascular system: S1-S2, RRR, no murmurs, no pedal edema Gastrointestinal system: Abdomen is nondistended, soft and nontender. No organomegaly or masses felt. Normal bowel sounds heard. Central nervous system: Alert and oriented. No focal neurological deficits. Extremities: Symmetric 5 x 5  power. Skin: No rashes, lesions or ulcers Psychiatry: Judgement and insight appear normal. Mood & affect appropriate.     Data Reviewed: I have personally reviewed following labs and imaging studies  CBC: Recent Labs  Lab 04/28/21 1457 04/29/21 0451 04/30/21 0536 05/01/21 0552  WBC 6.4 6.2 5.6 6.3  HGB 16.2 15.4 15.9 16.9  HCT 47.1 45.2 46.6 48.6  MCV 92.4 94.2 93.0 93.6  PLT 270 245 259 263   Basic Metabolic Panel: Recent Labs  Lab 04/28/21 1457 04/28/21 1900 04/29/21 0451 04/30/21 0536 05/01/21 0552  NA 139  --   --  138 139  K 3.4*  --   --  3.6 3.5  CL 103  --   --  103 103  CO2 30  --   --  27 28  GLUCOSE 92  --   --  112* 111*  BUN 12  --   --  18 15  CREATININE 1.68*  --  1.41* 1.42* 1.21  CALCIUM 8.9  --   --  9.0 8.9  MG  --  2.1  --  2.0 2.2   GFR: Estimated Creatinine Clearance: 98.8 mL/min (by C-G formula based on SCr of 1.21 mg/dL). Liver Function Tests: Recent Labs  Lab 04/28/21 1900  AST 23  ALT 24  ALKPHOS 54  BILITOT 1.4*  PROT 7.4  ALBUMIN 4.1   Recent Labs  Lab 04/28/21 1900  LIPASE 58*   No results for input(s): AMMONIA in the last 168 hours. Coagulation Profile: No results for input(s): INR, PROTIME in the last 168 hours. Cardiac Enzymes: No results for input(s): CKTOTAL, CKMB, CKMBINDEX, TROPONINI in the last 168 hours. BNP (last 3 results) No results for input(s): PROBNP in the last 8760 hours. HbA1C: No results for input(s): HGBA1C in the last 72 hours. CBG: No results for input(s): GLUCAP in the last 168 hours. Lipid Profile: No results for input(s): CHOL, HDL, LDLCALC, TRIG, CHOLHDL, LDLDIRECT in the last 72 hours. Thyroid Function Tests: No results for input(s): TSH, T4TOTAL, FREET4, T3FREE, THYROIDAB in the last 72 hours. Anemia Panel: No results for input(s): VITAMINB12, FOLATE, FERRITIN, TIBC, IRON, RETICCTPCT in the last 72 hours. Sepsis Labs: No results for input(s): PROCALCITON, LATICACIDVEN in the last 168  hours.  Recent Results (from the past 240 hour(s))  Resp Panel by RT-PCR (Flu A&B, Covid) Nasopharyngeal Swab     Status: None   Collection Time: 04/28/21  9:24 PM   Specimen: Nasopharyngeal Swab; Nasopharyngeal(NP) swabs in vial transport medium  Result Value Ref Range Status   SARS Coronavirus 2 by RT PCR NEGATIVE NEGATIVE Final    Comment: (NOTE) SARS-CoV-2 target nucleic acids are NOT DETECTED.  The SARS-CoV-2 RNA is generally detectable in upper respiratory specimens during the acute phase of infection. The lowest concentration of SARS-CoV-2 viral copies this assay can detect is 138 copies/mL. A negative result does not preclude SARS-Cov-2 infection and should not be used as the sole basis for treatment or other patient management decisions. A negative result  may occur with  improper specimen collection/handling, submission of specimen other than nasopharyngeal swab, presence of viral mutation(s) within the areas targeted by this assay, and inadequate number of viral copies(<138 copies/mL). A negative result must be combined with clinical observations, patient history, and epidemiological information. The expected result is Negative.  Fact Sheet for Patients:  BloggerCourse.com  Fact Sheet for Healthcare Providers:  SeriousBroker.it  This test is no t yet approved or cleared by the [place] FDA and  has been authorized for detection and/or diagnosis of SARS-CoV-2 by FDA under an Emergency Use Authorization (EUA). This EUA will remain  in effect (meaning this test can be used) for the duration of the COVID-19 declaration under Section 564(b)(1) of the Act, 21 U.S.C.section 360bbb-3(b)(1), unless the authorization is terminated  or revoked sooner.       Influenza A by PCR NEGATIVE NEGATIVE Final   Influenza B by PCR NEGATIVE NEGATIVE Final    Comment: (NOTE) The Xpert Xpress SARS-CoV-2/FLU/RSV plus assay is intended  as an aid in the diagnosis of influenza from Nasopharyngeal swab specimens and should not be used as a sole basis for treatment. Nasal washings and aspirates are unacceptable for Xpert Xpress SARS-CoV-2/FLU/RSV testing.  Fact Sheet for Patients: BloggerCourse.com  Fact Sheet for Healthcare Providers: SeriousBroker.it  This test is not yet approved or cleared by the [place] FDA and has been authorized for detection and/or diagnosis of SARS-CoV-2 by FDA under an Emergency Use Authorization (EUA). This EUA will remain in effect (meaning this test can be used) for the duration of the COVID-19 declaration under Section 564(b)(1) of the Act, 21 U.S.C. section 360bbb-3(b)(1), unless the authorization is terminated or revoked.  Performed at [facility], [address] [place], [place] [address]          Radiology Studies: No results found.      Scheduled Meds:  enoxaparin (LOVENOX) injection  0.5 mg/kg Subcutaneous Q24H   metoprolol succinate  50 mg Oral Daily   pantoprazole  40 mg Oral Daily   sacubitril-valsartan  1 tablet Oral BID   sodium chloride flush  3 mL Intravenous Q12H   Continuous Infusions:  sodium chloride     promethazine (PHENERGAN) injection (IM or IVPB)       LOS: 1 day    Time spent: 25 minutes    Tresa Moore, MD [facility]   If 7PM-7AM, please contact night-coverage  05/01/2021, 1:14 PM

## 2021-05-01 NOTE — Plan of Care (Signed)
  Problem: Clinical Measurements: Goal: Ability to maintain clinical measurements within normal limits will improve Outcome: Progressing   Problem: Clinical Measurements: Goal: Will remain free from infection Outcome: Progressing   Problem: Clinical Measurements: Goal: Diagnostic test results will improve Outcome: Progressing   

## 2021-05-01 NOTE — Progress Notes (Signed)
This morning pt was complaining of headache and N/v medicated pt with tylenol and HA improved, at 1700 went in check on pt he was withering in bed with headache again also N/V 10/10 bp check with in normal limits contacted Georgeann Oppenheim MD, MD placed orders and pt sent for scan. Meds administered. Will continue to monitor

## 2021-05-02 ENCOUNTER — Other Ambulatory Visit: Payer: Self-pay

## 2021-05-02 LAB — BASIC METABOLIC PANEL
Anion gap: 8 (ref 5–15)
BUN: 17 mg/dL (ref 6–20)
CO2: 25 mmol/L (ref 22–32)
Calcium: 9.4 mg/dL (ref 8.9–10.3)
Chloride: 101 mmol/L (ref 98–111)
Creatinine, Ser: 1.15 mg/dL (ref 0.61–1.24)
GFR, Estimated: >60 mL/min (ref >60)
Glucose, Bld: 131 mg/dL — ABNORMAL HIGH (ref 70–99)
Potassium: 3.7 mmol/L (ref 3.5–5.1)
Sodium: 134 mmol/L — ABNORMAL LOW (ref 135–145)

## 2021-05-02 LAB — CBC
HCT: 48.5 % (ref 39.0–52.0)
Hemoglobin: 16.9 g/dL (ref 13.0–17.0)
MCH: 32.3 pg (ref 26.0–34.0)
MCHC: 34.8 g/dL (ref 30.0–36.0)
MCV: 92.7 fL (ref 80.0–100.0)
Platelets: 282 10*3/uL (ref 150–400)
RBC: 5.23 MIL/uL (ref 4.22–5.81)
RDW: 13.7 % (ref 11.5–15.5)
WBC: 10.6 10*3/uL — ABNORMAL HIGH (ref 4.0–10.5)
nRBC: 0 % (ref 0.0–0.2)

## 2021-05-02 LAB — MAGNESIUM: Magnesium: 1.9 mg/dL (ref 1.7–2.4)

## 2021-05-02 MED ORDER — ONDANSETRON 4 MG PO TBDP
4.0000 mg | ORAL_TABLET | Freq: Three times a day (TID) | ORAL | 0 refills | Status: DC | PRN
  Filled 2021-05-02: qty 20, 7d supply, fill #0

## 2021-05-02 MED ORDER — METOPROLOL SUCCINATE ER 50 MG PO TB24
50.0000 mg | ORAL_TABLET | Freq: Every day | ORAL | 0 refills | Status: DC
  Filled 2021-05-02: qty 30, 30d supply, fill #0

## 2021-05-02 MED ORDER — SACUBITRIL-VALSARTAN 49-51 MG PO TABS
1.0000 | ORAL_TABLET | Freq: Two times a day (BID) | ORAL | 0 refills | Status: DC
Start: 2021-05-02 — End: 2021-05-13
  Filled 2021-05-02: qty 60, 30d supply, fill #0

## 2021-05-02 NOTE — Progress Notes (Signed)
   Heart Failure Nurse Navigator Note  Met with patient today, went over daily weights, what to report, symptoms to report by teach back method still needs reinforcement.  Also discussed fluid restriction and low-sodium diet.  He has follow-up appointment with Darylene Price in the heart failure clinic on October 18 at 2:30 in the afternoon.  He will also be seen by cardiology on October 11 at [place]

## 2021-05-02 NOTE — Progress Notes (Addendum)
CCMD made this nurse aware that patient was off monitor several times during shift. Replaced leads and informed patient to lay on his back for his monitor to pick up. Patient says "im tired of laying on my back. Patient noted several times sleeping on his stomach. Aware that pt wont keep on his telemetry for monitoring.

## 2021-05-02 NOTE — Progress Notes (Signed)
SUBJECTIVE: Andrew Walters is a 40 y.o. male with medical history significant for Hypertension not currently on antihypertensives and tobacco and cannabis use who presented to the ED with a 1 day history of chest pain, cough and shortness of breath associated with nonbloody nonbilious vomiting as well as some nonbloody diarrhea.  He denied fever or chills.  Described chest pain as a tightness in his anterior chest that comes and goes and not exertional, not radiating.  Patient resting in bed. Denies chest pain, shortness of breath. Headache has resolved.    Vitals:   05/01/21 2033 05/01/21 2355 05/02/21 0236 05/02/21 0356  BP: (!) 155/95 (!) 163/89  (!) 157/101  Pulse: 81 74  80  Resp: 20 20  20   Temp: 98.4 F (36.9 C) 97.6 F (36.4 C)  98.3 F (36.8 C)  TempSrc: Oral Oral    SpO2: 94% 98%  92%  Weight:   104.4 kg   Height:        Intake/Output Summary (Last 24 hours) at 05/02/2021 0850 Last data filed at 05/02/2021 0530 Gross per 24 hour  Intake 240 ml  Output 300 ml  Net -60 ml    LABS: Basic Metabolic Panel: Recent Labs    05/01/21 0552 05/02/21 0615  NA 139 134*  K 3.5 3.7  CL 103 101  CO2 28 25  GLUCOSE 111* 131*  BUN 15 17  CREATININE 1.21 1.15  CALCIUM 8.9 9.4  MG 2.2 1.9   Liver Function Tests: No results for input(s): AST, ALT, ALKPHOS, BILITOT, PROT, ALBUMIN in the last 72 hours. No results for input(s): LIPASE, AMYLASE in the last 72 hours. CBC: Recent Labs    05/01/21 0552 05/02/21 0615  WBC 6.3 10.6*  HGB 16.9 16.9  HCT 48.6 48.5  MCV 93.6 92.7  PLT 263 282   Cardiac Enzymes: No results for input(s): CKTOTAL, CKMB, CKMBINDEX, TROPONINI in the last 72 hours. BNP: Invalid input(s): POCBNP D-Dimer: No results for input(s): DDIMER in the last 72 hours. Hemoglobin A1C: No results for input(s): HGBA1C in the last 72 hours. Fasting Lipid Panel: No results for input(s): CHOL, HDL, LDLCALC, TRIG, CHOLHDL, LDLDIRECT in the last 72 hours. Thyroid  Function Tests: No results for input(s): TSH, T4TOTAL, T3FREE, THYROIDAB in the last 72 hours.  Invalid input(s): FREET3 Anemia Panel: No results for input(s): VITAMINB12, FOLATE, FERRITIN, TIBC, IRON, RETICCTPCT in the last 72 hours.   PHYSICAL EXAM General: Well developed, well nourished, in no acute distress HEENT:  Normocephalic and atramatic Neck:  No JVD.  Lungs: Clear bilaterally to auscultation and percussion. Heart: HRRR . Normal S1 and S2 without gallops or murmurs.  Abdomen: Bowel sounds are positive, abdomen soft and non-tender  Msk:  Back normal, normal gait. Normal strength and tone for age. Extremities: No clubbing, cyanosis or edema.   Neuro: Alert and oriented X 3. Psych:  Good affect, responds appropriately  TELEMETRY: NSR, HR 82  ASSESSMENT AND PLAN: Blood pressure continues to be elevated. Patient cleared from a cardiology standpoint to go home. Should continue Entresto 49-51 twice daily and metoprolol 50 mg once daily. Follow up in office Tuesday, 05/06/21 at 10:00 am.  Active Problems:   AKI (acute kidney injury) (HCC)   Substance abuse (HCC)   Chest pain   Hypertensive urgency   Nausea and vomiting   Acute CHF (congestive heart failure) (HCC)   Cardiomyopathy (HCC)    Kira Hartl, FNP-C 05/02/2021 8:50 AM

## 2021-05-02 NOTE — Discharge Summary (Signed)
Physician Discharge Summary  Andrew Walters YIR:[id number] DOB: 25-Dec-1980 DOA: 04/28/2021  PCP: Patient, [name] (Inactive)  Admit date: 04/28/2021 Discharge date: 05/02/2021  Admitted From: Home Disposition: Home  Recommendations for Outpatient Follow-up:  Follow up with PCP in 1-2 weeks Follow-up with cardiology Dr. Lennette Bihari 05/06/2021 at 10 AM Follow-up in advanced heart failure clinic 05/13/2021 at 2:30 PM  Home Health: No Equipment/Devices: None  Discharge Condition: Stable CODE STATUS: Full Diet recommendation: Heart healthy/low-sodium  Brief/Interim Summary: 40 y.o. male with medical history significant for Hypertension not currently on antihypertensives and tobacco and cannabis use who presents to the ED with a 1 day history of chest pain, cough and shortness of breath associated with nonbloody nonbilious vomiting as well as some nonbloody diarrhea.   Cardiology consulted.  Recommend medical management.  Titrating goal-directed medical therapy.  On 10/6 patient reported intractable nausea and vomiting and severe headache.  This resolved on time of discharge.  Will discharge on metoprolol 50 mg daily and Entresto twice daily.  Follow-up in cardiology office 05/06/2021 at 10 AM.  Follow-up advanced heart failure clinic 05/13/2021 at 2:30 PM    Discharge Diagnoses:  Active Problems:   AKI (acute kidney injury) (HCC)   Substance abuse (HCC)   Chest pain   Hypertensive urgency   Nausea and vomiting   Acute CHF (congestive heart failure) (HCC)   Cardiomyopathy (HCC) Chest pain, resolved elevated troponin likely due to demand ischemia Suspect related to hypertensive urgency/emergency as well as vomiting CTA chest negative for PE Suspect demand ischemia.  EKG nonacute and troponin downtrending from 65-56 Cardiology consulted, no plans for ischemic evaluation Plan: Discharge home.  As needed antiemetics   Severe combined CHF, chronic Cardiomyopathy, unclear type --Echo today  showed LVEF 25-30%, worsened from 45-50% about 1.5 years ago.  Risk factors include past cocaine use and uncontrolled HTN. --does not appear to be in exacerbation, currently on room air. --Pt currently is not taking any medications at home and does not have a PCP. --Cardiology consulted. --started on Toprol and Entresto  Plan: Discharge home.  Continue metoprolol and Entresto.  Follow-up with cardiology Dr. Lennette Bihari office 05/06/2021 at 10 AM.  Follow-up in advanced heart failure clinic 05/13/2021 at 2:30 PM   hypertensive urgency  - BP as high as 178/125 in the ED --pt was not taking any BP medications at home Plan: --cont Toprol and Entresto (both new), started 10/5     AKI, ruled out - Creatinine 1.68 on presentation, which is around baseline.   Nausea, vomiting, diarrhea - Possible viral illness, or cannabis use. - Received a gentle bolus in the ED --supportive care -- Heart healthy diet as tolerated   Hx of cocaine use --Pt reported quitting about 7 months ago.  UDS neg for cocaine. --continued cessation encouraged due to cardiomyopathy    Discharge Instructions  Discharge Instructions     Diet - low sodium heart healthy   Complete by: As directed    Increase activity slowly   Complete by: As directed       Allergies as of 05/02/2021   No Known Allergies      Medication List     STOP taking these medications    hydrochlorothiazide 25 MG tablet Commonly known as: HYDRODIURIL   lisinopril 20 MG tablet Commonly known as: ZESTRIL       TAKE these medications    metoprolol succinate 50 MG 24 hr tablet Commonly known as: TOPROL-XL Take 1 tablet (50 mg total) by  mouth daily. Take with or immediately following a meal. Start taking on: May 03, 2021   ondansetron 4 MG disintegrating tablet Commonly known as: Zofran ODT Take 1 tablet (4 mg total) by mouth every 8 (eight) hours as needed for nausea or vomiting.   sacubitril-valsartan 49-51 MG Commonly  known as: ENTRESTO Take 1 tablet by mouth 2 (two) times daily.        Follow-up Information     Laurier Nancy, MD Follow up on 05/06/2021.   Specialty: Cardiology Why: Call office to confirm.  You are scheduled for Tuesday 10/11 at 10AM. Contact information: [address] [place] [address] [phone]                No Known Allergies  Consultations: Cardiology-Dr. Lennette Bihari   Procedures/Studies: DG Chest 2 View  Result Date: 04/28/2021 CLINICAL DATA:  Chest pain, shortness of breath, nausea, vomiting, diarrhea, sore throat EXAM: CHEST - 2 VIEW COMPARISON:  Chest radiograph 04/28/2020 FINDINGS: The heart is enlarged, unchanged. The mediastinal contours are within normal limits. There is vascular congestion without overt pulmonary edema. There is no focal consolidation. There is no pleural effusion or pneumothorax. There is no acute osseous abnormality. IMPRESSION: Cardiomegaly. Otherwise, no radiographic evidence of acute cardiopulmonary process. Electronically Signed   By: Lesia Hausen M.D.   On: 04/28/2021 15:38   CT HEAD WO CONTRAST ( )  Result Date: 05/01/2021 CLINICAL DATA:  Initial evaluation for acute headache. EXAM: CT HEAD WITHOUT CONTRAST TECHNIQUE: Contiguous axial images were obtained from the base of the skull through the vertex without intravenous contrast. COMPARISON:  Head CT from 11/22/2019. FINDINGS: Brain: Cerebral volume within normal limits for age. No acute intracranial hemorrhage. No acute large vessel territory infarct. No mass lesion, midline shift or mass effect. No hydrocephalus or extra-axial fluid collection. Vascular: No hyperdense vessel. Skull: Scalp soft tissues and calvarium within normal limits. Sinuses/Orbits: Globes orbital soft tissues demonstrate no acute finding. Paranasal sinuses are largely clear. Left mastoid effusion noted. Right mastoid air cells clear. Other: None. IMPRESSION: 1. Negative head CT. No acute intracranial  abnormality identified. 2. Left mastoid effusion. Electronically Signed   By: Rise Mu M.D.   On: 05/01/2021 19:37   CT Angio Chest PE W and/or Wo Contrast  Result Date: 04/28/2021 CLINICAL DATA:  Chest pain, shortness of breath with nausea and vomiting. EXAM: CT ANGIOGRAPHY CHEST WITH CONTRAST TECHNIQUE: Multidetector CT imaging of the chest was performed using the standard protocol during bolus administration of intravenous contrast. Multiplanar CT image reconstructions and MIPs were obtained to evaluate the vascular anatomy. CONTRAST:  75mL OMNIPAQUE IOHEXOL 350 MG/ML SOLN COMPARISON:  None. FINDINGS: Cardiovascular: Satisfactory opacification of the pulmonary arteries to the segmental level. No evidence of pulmonary embolism. There is mild cardiomegaly. No pericardial effusion. Mediastinum/Nodes: There is mild AP window and bilateral hilar lymphadenopathy. Thyroid gland, trachea, and esophagus demonstrate no significant findings. Lungs/Pleura: Mild linear atelectasis is seen within the left lung base. There is no evidence of acute infiltrate, pleural effusion or pneumothorax. Upper Abdomen: No acute abnormality. Musculoskeletal: No chest wall abnormality. No acute or significant osseous findings. Review of the MIP images confirms the above findings. IMPRESSION: 1. No CT evidence of pulmonary embolism or acute cardiopulmonary disease. 2. Mild cardiomegaly. 3. Mild AP window and bilateral hilar lymphadenopathy, likely reactive. Electronically Signed   By: Aram Candela M.D.   On: 04/28/2021 23:17   ECHOCARDIOGRAM COMPLETE  Result Date: 04/29/2021    ECHOCARDIOGRAM REPORT   Patient Name:  Andrew Walters Date of Exam: 04/29/2021 Medical Rec #:  [id number]   Height:       71.0 in Accession #:    [id number]  Weight:       230.0 lb Date of Birth:  January 13, 1981   BSA:          2.238 m Patient Age:    40 years    BP:           151/110 mmHg Patient Gender: M           HR:           93 bpm. Exam Location:   [facility] Procedure: 2D Echo, Cardiac Doppler and Color Doppler Indications:     CHF-acute diastolic I50.31  History:         Patient has prior history of Echocardiogram examinations, most                  recent 11/22/2020. Risk Factors:Hypertension.  Sonographer:     Cristela Blue Referring Phys:  [id number] AMBER SCOGGINS Diagnosing Phys: Adrian Blackwater IMPRESSIONS  1. Left ventricular ejection fraction, by estimation, is 25 to 30%. The left ventricle has severely decreased function. The left ventricle demonstrates global hypokinesis. The left ventricular internal cavity size was severely dilated. There is mild concentric left ventricular hypertrophy. Left ventricular diastolic parameters are consistent with Grade II diastolic dysfunction (pseudonormalization).  2. Right ventricular systolic function is severely reduced. The right ventricular size is moderately enlarged. Mildly increased right ventricular wall thickness.  3. Left atrial size was severely dilated.  4. Right atrial size was severely dilated.  5. The mitral valve is grossly normal. Mild mitral valve regurgitation.  6. The aortic valve is grossly normal. Aortic valve regurgitation is not visualized. FINDINGS  Left Ventricle: Left ventricular ejection fraction, by estimation, is 25 to 30%. The left ventricle has severely decreased function. The left ventricle demonstrates global hypokinesis. The left ventricular internal cavity size was severely dilated. There is mild concentric left ventricular hypertrophy. Left ventricular diastolic parameters are consistent with Grade II diastolic dysfunction (pseudonormalization). Right Ventricle: The right ventricular size is moderately enlarged. Mildly increased right ventricular wall thickness. Right ventricular systolic function is severely reduced. Left Atrium: Left atrial size was severely dilated. Right Atrium: Right atrial size was severely dilated. Pericardium: There is no evidence of pericardial effusion. Mitral  Valve: The mitral valve is grossly normal. Mild mitral valve regurgitation. Tricuspid Valve: The tricuspid valve is grossly normal. Tricuspid valve regurgitation is mild. Aortic Valve: The aortic valve is grossly normal. Aortic valve regurgitation is not visualized. Aortic valve mean gradient measures 3.0 mmHg. Aortic valve peak gradient measures 5.6 mmHg. Aortic valve area, by VTI measures 2.44 cm. Pulmonic Valve: The pulmonic valve was grossly normal. Pulmonic valve regurgitation is trivial. Aorta: The aortic root, ascending aorta and aortic arch are all structurally normal, with no evidence of dilitation or obstruction. IAS/Shunts: No atrial level shunt detected by color flow Doppler. There is no evidence of a patent foramen ovale. There is no evidence of an atrial septal defect.  LEFT VENTRICLE PLAX 2D LVIDd:         6.50 cm      Diastology LVIDs:         5.40 cm      LV e' medial:    5.22 cm/s LV PW:         1.30 cm      LV E/e' medial:  16.3 LV IVS:  1.05 cm      LV e' lateral:   5.98 cm/s LVOT diam:     2.20 cm      LV E/e' lateral: 14.3 LV SV:         49 LV SV Index:   22 LVOT Area:     3.80 cm  LV Volumes (MOD) LV vol d, MOD A2C: 138.0 ml LV vol d, MOD A4C: 152.0 ml LV vol s, MOD A2C: 101.0 ml LV vol s, MOD A4C: 92.6 ml LV SV MOD A2C:     37.0 ml LV SV MOD A4C:     152.0 ml LV SV MOD BP:      53.8 ml RIGHT VENTRICLE RV Basal diam:  4.20 cm RV S prime:     11.00 cm/s TAPSE (M-mode): 4.5 cm LEFT ATRIUM              Index       RIGHT ATRIUM           Index LA diam:        4.20 cm  1.88 cm/m  RA Area:     24.30 cm LA Vol (A2C):   121.0 ml 54.08 ml/m RA Volume:   75.90 ml  33.92 ml/m LA Vol (A4C):   100.0 ml 44.69 ml/m LA Biplane Vol: 113.0 ml 50.50 ml/m  AORTIC VALVE                   PULMONIC VALVE AV Area (Vmax):    2.12 cm    PV Vmax:        0.54 m/s AV Area (Vmean):   2.03 cm    PV Peak grad:   1.2 mmHg AV Area (VTI):     2.44 cm    RVOT Peak grad: 2 mmHg AV Vmax:           118.00 cm/s AV  Vmean:          86.500 cm/s AV VTI:            0.201 m AV Peak Grad:      5.6 mmHg AV Mean Grad:      3.0 mmHg LVOT Vmax:         65.90 cm/s LVOT Vmean:        46.200 cm/s LVOT VTI:          0.129 m LVOT/AV VTI ratio: 0.64  AORTA Ao Root diam: 3.17 cm MITRAL VALVE               TRICUSPID VALVE MV Area (PHT): 5.09 cm    TR Peak grad:   22.3 mmHg MV Decel Time: 149 msec    TR Vmax:        236.00 cm/s MV E velocity: 85.30 cm/s MV A velocity: 37.30 cm/s  SHUNTS MV E/A ratio:  2.29        Systemic VTI:  0.13 m                            Systemic Diam: 2.20 cm Adrian Blackwater Electronically signed by Adrian Blackwater Signature Date/Time: 04/29/2021/1:13:03 PM    Final       Subjective: Seen and examined on the day of discharge.  Chest pain resolved.  Nausea and vomiting resolved.  Tolerating p.o.  Stable for discharge home.  Discharge Exam: Vitals:   05/01/21 2355 05/02/21 0356  BP: (!) 163/89 (!) 157/101  Pulse: 74 80  Resp: 20 20  Temp: 97.6 F (36.4 C) 98.3 F (36.8 C)  SpO2: 98% 92%   Vitals:   05/01/21 2033 05/01/21 2355 05/02/21 0236 05/02/21 0356  BP: (!) 155/95 (!) 163/89  (!) 157/101  Pulse: 81 74  80  Resp: Temp: 98.4 F (36.9 C) 97.6 F (36.4 C)  98.3 F (36.8 C)  TempSrc: Oral Oral    SpO2: 94% 98%  92%  Weight:   104.4 kg   Height:        General: Pt is alert, awake, not in acute distress Cardiovascular: RRR, S1/S2 +, no rubs, no gallops Respiratory: CTA bilaterally, no wheezing, no rhonchi Abdominal: Soft, NT, ND, bowel sounds + Extremities: no edema, no cyanosis    The results of significant diagnostics from this hospitalization (including imaging, microbiology, ancillary and laboratory) are listed below for reference.     Microbiology: Recent Results (from the past 240 hour(s))  Resp Panel by RT-PCR (Flu A&B, Covid) Nasopharyngeal Swab     Status: None   Collection Time: 04/28/21  9:24 PM   Specimen: Nasopharyngeal Swab; Nasopharyngeal(NP) swabs in vial  transport medium  Result Value Ref Range Status   SARS Coronavirus 2 by RT PCR NEGATIVE NEGATIVE Final    Comment: (NOTE) SARS-CoV-2 target nucleic acids are NOT DETECTED.  The SARS-CoV-2 RNA is generally detectable in upper respiratory specimens during the acute phase of infection. The lowest concentration of SARS-CoV-2 viral copies this assay can detect is 138 copies/mL. A negative result does not preclude SARS-Cov-2 infection and should not be used as the sole basis for treatment or other patient management decisions. A negative result may occur with  improper specimen collection/handling, submission of specimen other than nasopharyngeal swab, presence of viral mutation(s) within the areas targeted by this assay, and inadequate number of viral copies(<138 copies/mL). A negative result must be combined with clinical observations, patient history, and epidemiological information. The expected result is Negative.  Fact Sheet for Patients:  BloggerCourse.com  Fact Sheet for Healthcare Providers:  SeriousBroker.it  This test is no t yet approved or cleared by the [place] FDA and  has been authorized for detection and/or diagnosis of SARS-CoV-2 by FDA under an Emergency Use Authorization (EUA). This EUA will remain  in effect (meaning this test can be used) for the duration of the COVID-19 declaration under Section 564(b)(1) of the Act, 21 U.S.C.section 360bbb-3(b)(1), unless the authorization is terminated  or revoked sooner.       Influenza A by PCR NEGATIVE NEGATIVE Final   Influenza B by PCR NEGATIVE NEGATIVE Final    Comment: (NOTE) The Xpert Xpress SARS-CoV-2/FLU/RSV plus assay is intended as an aid in the diagnosis of influenza from Nasopharyngeal swab specimens and should not be used as a sole basis for treatment. Nasal washings and aspirates are unacceptable for Xpert Xpress SARS-CoV-2/FLU/RSV testing.  Fact  Sheet for Patients: BloggerCourse.com  Fact Sheet for Healthcare Providers: SeriousBroker.it  This test is not yet approved or cleared by the [place] FDA and has been authorized for detection and/or diagnosis of SARS-CoV-2 by FDA under an Emergency Use Authorization (EUA). This EUA will remain in effect (meaning this test can be used) for the duration of the COVID-19 declaration under Section 564(b)(1) of the Act, 21 U.S.C. section 360bbb-3(b)(1), unless the authorization is terminated or revoked.  Performed at [facility], [address] [place], [place] [address]      Labs: BNP (last 3 results) Recent  Labs    04/28/21 1457  BNP 396.2*   Basic Metabolic Panel: Recent Labs  Lab 04/28/21 1457 04/28/21 1900 04/29/21 0451 04/30/21 0536 05/01/21 0552 05/02/21 0615  NA 139  --   --  138 139 134*  K 3.4*  --   --  3.6 3.5 3.7  CL 103  --   --  103 103 101  CO2 30  --   --  27 28 25   GLUCOSE 92  --   --  112* 111* 131*  BUN 12  --   --  18 15 17   CREATININE 1.68*  --  1.41* 1.42* 1.21 1.15  CALCIUM 8.9  --   --  9.0 8.9 9.4  MG  --  2.1  --  2.0 2.2 1.9   Liver Function Tests: Recent Labs  Lab 04/28/21 1900  AST 23  ALT 24  ALKPHOS 54  BILITOT 1.4*  PROT 7.4  ALBUMIN 4.1   Recent Labs  Lab 04/28/21 1900  LIPASE 58*   No results for input(s): AMMONIA in the last 168 hours. CBC: Recent Labs  Lab 04/28/21 1457 04/29/21 0451 04/30/21 0536 05/01/21 0552 05/02/21 0615  WBC 6.4 6.2 5.6 6.3 10.6*  HGB 16.2 15.4 15.9 16.9 16.9  HCT 47.1 45.2 46.6 48.6 48.5  MCV 92.4 94.2 93.0 93.6 92.7  PLT 270 245 259 263 282   Cardiac Enzymes: No results for input(s): CKTOTAL, CKMB, CKMBINDEX, TROPONINI in the last 168 hours. BNP: Invalid input(s): POCBNP CBG: No results for input(s): GLUCAP in the last 168 hours. D-Dimer No results for input(s): DDIMER in the last 72 hours. Hgb A1c No results for  input(s): HGBA1C in the last 72 hours. Lipid Profile No results for input(s): CHOL, HDL, LDLCALC, TRIG, CHOLHDL, LDLDIRECT in the last 72 hours. Thyroid function studies No results for input(s): TSH, T4TOTAL, T3FREE, THYROIDAB in the last 72 hours.  Invalid input(s): FREET3 Anemia work up No results for input(s): VITAMINB12, FOLATE, FERRITIN, TIBC, IRON, RETICCTPCT in the last 72 hours. Urinalysis    Component Value Date/Time   COLORURINE YELLOW (A) 04/28/2021 2115   APPEARANCEUR CLEAR (A) 04/28/2021 2115   LABSPEC 1.024 04/28/2021 2115   PHURINE 5.0 04/28/2021 2115   GLUCOSEU NEGATIVE 04/28/2021 2115   HGBUR SMALL (A) 04/28/2021 2115   BILIRUBINUR NEGATIVE 04/28/2021 2115   KETONESUR NEGATIVE 04/28/2021 2115   PROTEINUR 100 (A) 04/28/2021 2115   NITRITE NEGATIVE 04/28/2021 2115   LEUKOCYTESUR TRACE (A) 04/28/2021 2115   Sepsis Labs Invalid input(s): PROCALCITONIN,  WBC,  LACTICIDVEN Microbiology Recent Results (from the past 240 hour(s))  Resp Panel by RT-PCR (Flu A&B, Covid) Nasopharyngeal Swab     Status: None   Collection Time: 04/28/21  9:24 PM   Specimen: Nasopharyngeal Swab; Nasopharyngeal(NP) swabs in vial transport medium  Result Value Ref Range Status   SARS Coronavirus 2 by RT PCR NEGATIVE NEGATIVE Final    Comment: (NOTE) SARS-CoV-2 target nucleic acids are NOT DETECTED.  The SARS-CoV-2 RNA is generally detectable in upper respiratory specimens during the acute phase of infection. The lowest concentration of SARS-CoV-2 viral copies this assay can detect is 138 copies/mL. A negative result does not preclude SARS-Cov-2 infection and should not be used as the sole basis for treatment or other patient management decisions. A negative result may occur with  improper specimen collection/handling, submission of specimen other than nasopharyngeal swab, presence of viral mutation(s) within the areas targeted by this assay, and inadequate number of viral copies(<138  copies/mL). A negative result must be combined with clinical observations, patient history, and epidemiological information. The expected result is Negative.  Fact Sheet for Patients:  BloggerCourse.com  Fact Sheet for Healthcare Providers:  SeriousBroker.it  This test is no t yet approved or cleared by the [place] FDA and  has been authorized for detection and/or diagnosis of SARS-CoV-2 by FDA under an Emergency Use Authorization (EUA). This EUA will remain  in effect (meaning this test can be used) for the duration of the COVID-19 declaration under Section 564(b)(1) of the Act, 21 U.S.C.section 360bbb-3(b)(1), unless the authorization is terminated  or revoked sooner.       Influenza A by PCR NEGATIVE NEGATIVE Final   Influenza B by PCR NEGATIVE NEGATIVE Final    Comment: (NOTE) The Xpert Xpress SARS-CoV-2/FLU/RSV plus assay is intended as an aid in the diagnosis of influenza from Nasopharyngeal swab specimens and should not be used as a sole basis for treatment. Nasal washings and aspirates are unacceptable for Xpert Xpress SARS-CoV-2/FLU/RSV testing.  Fact Sheet for Patients: BloggerCourse.com  Fact Sheet for Healthcare Providers: SeriousBroker.it  This test is not yet approved or cleared by the [place] FDA and has been authorized for detection and/or diagnosis of SARS-CoV-2 by FDA under an Emergency Use Authorization (EUA). This EUA will remain in effect (meaning this test can be used) for the duration of the COVID-19 declaration under Section 564(b)(1) of the Act, 21 U.S.C. section 360bbb-3(b)(1), unless the authorization is terminated or revoked.  Performed at [facility], [address] [place], [place] [address]      Time coordinating discharge: Over 30 minutes  SIGNED:   Tresa Moore, MD  [facility] 05/02/2021, 1:21  PM Pager   If 7PM-7AM, please contact night-coverage

## 2021-05-06 ENCOUNTER — Other Ambulatory Visit: Payer: Self-pay

## 2021-05-06 MED ORDER — SPIRONOLACTONE 25 MG PO TABS
ORAL_TABLET | Freq: Every day | ORAL | 0 refills | Status: DC
  Filled 2021-05-06: qty 30, 30d supply, fill #0

## 2021-05-09 ENCOUNTER — Ambulatory Visit: Payer: Self-pay | Admitting: Family

## 2021-05-12 NOTE — Progress Notes (Signed)
Patient ID: Andrew Walters, male    DOB: [birth year], 40 y.o.   MRN: [id number]  HPI  Andrew Walters is Walters 40 y/o male with Walters history of HTN, current tobacco use and chronic heart failure.   Echo report from 04/29/21 reviewed and showed an EF of 25-30% along with mild LVH, severe LAE and mild Andrew.   Admitted 04/28/21 due to chest pain and shortness of breath. Cardiology consult obtained. Elevated troponin thought to be due to demand ischemia. Chest CTA negative for PE. HTN meds started. Discharged after 4 days.   He presents today for his initial visit with Walters chief complaint of moderate fatigue with little exertion. He describes this as having been present for several months. He has associated productive cough, shortness of breath, intermittent chest pain, palpitations, dizziness and depression along with this. He denies any pedal edema, abdominal distention or difficulty sleeping.   Has scales to weigh. Admits to quite Walters bit of stress at home. No longer able to work at [employer] due to his symptoms so was asking questions about disability process. Also says that his wife is now involved with another man although patient and spouse are still living in the same house. He says that she's getting ready to move and that he can not move with her. Mom lives with his brother and they are going to be moving to [place] so feels like he's getting ready to be homeless.   Says that he couldn't afford the stress test copay but is getting some sort of insurance that will be taking effect on 05/27/21 which will cover the stress test copay and "other things".   [facility] at [facility] and says that he signed "some paper" but can't clarify if he's turned everything in or not.   Past Medical History:  Diagnosis Date   CHF (congestive heart failure) (HCC)    Hypertension    History reviewed. No pertinent surgical history. History reviewed. No pertinent family  history. Social History   Tobacco Use   Smoking status: Every Day   Smokeless tobacco: Never  Substance Use Topics   Alcohol use: No   No Known Allergies Prior to Admission medications   Medication Sig Start Date End Date Taking? Authorizing Provider  metoprolol succinate (TOPROL-XL) 50 MG 24 hr tablet Take 1 tablet (50 mg total) by mouth once daily. Take with or immediately following Walters meal. 05/13/21 06/12/21  Andrew Walters, Andrew Fermo Walters, Andrew Walters  sacubitril-valsartan (ENTRESTO) 49-51 MG Take 1 tablet by mouth 2 (two) times daily. 05/13/21 06/12/21  Andrew Freeze, Andrew Walters  spironolactone (ALDACTONE) 25 MG tablet TAKE ONE TABLET BY MOUTH ONCE DAILY 05/13/21   Andrew Freeze, Andrew Walters    Review of Systems  Constitutional:  Positive for fatigue (tire easily). Negative for appetite change.  HENT:  Negative for congestion, postnasal drip and sore throat.   Eyes: Negative.   Respiratory:  Positive for cough (productive) and shortness of breath (worse with stress).   Cardiovascular:  Positive for chest pain (sometimes) and palpitations. Negative for leg swelling.  Gastrointestinal:  Negative for abdominal distention and abdominal pain.  Endocrine: Negative.   Genitourinary: Negative.   Musculoskeletal:  Positive for arthralgias (legs hurt at times). Negative for back pain.  Skin: Negative.   Allergic/Immunologic: Negative.   Neurological:  Positive for dizziness. Negative for light-headedness.  Hematological:  Negative for adenopathy. Does not bruise/bleed easily.  Psychiatric/Behavioral:  Positive for dysphoric mood and suicidal  ideas (no suicidan plan just intermittent thoughts). Negative for self-injury and sleep disturbance (sleeping on 1 pillow). The patient is not nervous/anxious.    Vitals:   05/13/21 1235  BP: (!) 154/97  Pulse: 81  Resp: 20  SpO2: 96%  Weight: 232 lb (105.2 kg)  Height: 5\' 11"  (1.803 m)   Wt Readings from Last 3 Encounters:  05/13/21 232 lb (105.2 kg)  05/02/21 230 lb 1.6 oz  (104.4 kg)  04/28/20 225 lb (102.1 kg)   Lab Results  Component Value Date   CREATININE 1.28 (H) 05/13/2021   CREATININE 1.15 05/02/2021   CREATININE 1.21 05/01/2021    Physical Exam Vitals and nursing note reviewed.  Constitutional:      Appearance: Normal appearance.  HENT:     Head: Normocephalic and atraumatic.  Cardiovascular:     Rate and Rhythm: Normal rate and regular rhythm.  Pulmonary:     Effort: Pulmonary effort is normal. No respiratory distress.     Breath sounds: Rhonchi (bilateral lower lobes) present. No wheezing or rales.  Abdominal:     General: There is no distension.     Palpations: Abdomen is soft.  Musculoskeletal:        General: No tenderness.     Cervical back: Normal range of motion and neck supple.     Right lower leg: No edema.     Left lower leg: No edema.  Skin:    General: Skin is warm and dry.  Neurological:     General: No focal deficit present.     Mental Status: He is alert and oriented to person, place, and time.  Psychiatric:        Mood and Affect: Mood is depressed. Mood is not anxious.        Behavior: Behavior normal.        Thought Content: Thought content normal.    Assessment & Plan:  1: Chronic heart failure with reduced ejection fraction- - NYHA class III - euvolemic today - has scales and has been weighing daily; reminded to call for an overnight weight gain of > 2 pounds or Walters weekly weight gain of >5 pounds - not adding salt; low sodium cookbook provided to him and reviewed the importance of reading food labels - saw cardiology 07/01/2021) recently and was supposed to get Walters stress test but he didn't have the money for the copay; waiting on insurance to take effect 05/27/21 - on GDMT of metoprolol, entresto and spironolactone - will check labs today since he's been on spironolactone ~ 10 days - plan to titrate GDMT at future visits along with adding SGLT2 - BNP 04/28/21 was 396.2  2: HTN- - BP mildly elevated today  (154/97) - BMP 05/02/21 reviewed and showed sodium 134, potassium 3.7, creatinine 1.15 and GFR >60 - discussed [facility] as he's currently uninsured but he defers because he says that he's getting "some sort of insurance that cost .51 cents and will cover copays"  3: Substance use- - UDS + for marijuana 04/29/21 - smokes 1 cigarette daily  4: Depression/ stress- - endorses Walters lot of stressors that he knows are affecting his heart - he lives in the same house as his wife but she's seeing another man; says that she is getting ready to move but that he can not go move with her - mother currently lives with his brother and he is getting ready to move to [place] - son is asking patient if he will  be alive at Halloween - no longer is working at [employer] and is asking about disability; advised him to go to social services office and inquire about disability process - he endorses suicidal thoughts over the last couple of weeks but denies any plan   Medication bottles reviewed. Sent 90 day supply RX of all [facility] meds to [facility] although I'm unclear if he's filled out all the paperwork for them and he admits that he has no idea.   Return in 1 month or sooner for any questions/problems before then. Plan to recheck labs again at that time as it'll be Walters little over month that he'll be on spironolactone.

## 2021-05-13 ENCOUNTER — Other Ambulatory Visit
Admission: RE | Admit: 2021-05-13 | Discharge: 2021-05-13 | Disposition: A | Discharge status: Home / Self Care | Payer: Self-pay | Source: Ambulatory Visit | Attending: Family | Admitting: Family

## 2021-05-13 ENCOUNTER — Ambulatory Visit: Payer: Self-pay | Attending: Family | Admitting: Family

## 2021-05-13 ENCOUNTER — Other Ambulatory Visit: Payer: Self-pay

## 2021-05-13 ENCOUNTER — Encounter: Payer: Self-pay | Admitting: Family

## 2021-05-13 VITALS — BP 154/97 | HR 81 | Resp 20 | Ht 71.0 in | Wt 232.0 lb

## 2021-05-13 DIAGNOSIS — F17210 Nicotine dependence, cigarettes, uncomplicated: Secondary | ICD-10-CM | POA: Insufficient documentation

## 2021-05-13 DIAGNOSIS — F1910 Other psychoactive substance abuse, uncomplicated: Secondary | ICD-10-CM

## 2021-05-13 DIAGNOSIS — R059 Cough, unspecified: Secondary | ICD-10-CM | POA: Insufficient documentation

## 2021-05-13 DIAGNOSIS — Z630 Problems in relationship with spouse or partner: Secondary | ICD-10-CM | POA: Insufficient documentation

## 2021-05-13 DIAGNOSIS — I10 Essential (primary) hypertension: Secondary | ICD-10-CM

## 2021-05-13 DIAGNOSIS — I110 Hypertensive heart disease with heart failure: Secondary | ICD-10-CM | POA: Insufficient documentation

## 2021-05-13 DIAGNOSIS — F1290 Cannabis use, unspecified, uncomplicated: Secondary | ICD-10-CM | POA: Insufficient documentation

## 2021-05-13 DIAGNOSIS — Z79899 Other long term (current) drug therapy: Secondary | ICD-10-CM | POA: Insufficient documentation

## 2021-05-13 DIAGNOSIS — F32A Depression, unspecified: Secondary | ICD-10-CM | POA: Insufficient documentation

## 2021-05-13 DIAGNOSIS — R0789 Other chest pain: Secondary | ICD-10-CM | POA: Insufficient documentation

## 2021-05-13 DIAGNOSIS — I5022 Chronic systolic (congestive) heart failure: Secondary | ICD-10-CM | POA: Insufficient documentation

## 2021-05-13 DIAGNOSIS — Z596 Low income: Secondary | ICD-10-CM | POA: Insufficient documentation

## 2021-05-13 DIAGNOSIS — F329 Major depressive disorder, single episode, unspecified: Secondary | ICD-10-CM

## 2021-05-13 DIAGNOSIS — Z560 Unemployment, unspecified: Secondary | ICD-10-CM | POA: Insufficient documentation

## 2021-05-13 LAB — BASIC METABOLIC PANEL
Anion gap: 8 (ref 5–15)
BUN: 13 mg/dL (ref 6–20)
CO2: 25 mmol/L (ref 22–32)
Calcium: 9.3 mg/dL (ref 8.9–10.3)
Chloride: 104 mmol/L (ref 98–111)
Creatinine, Ser: 1.28 mg/dL — ABNORMAL HIGH (ref 0.61–1.24)
GFR, Estimated: >60 mL/min (ref >60)
Glucose, Bld: 89 mg/dL (ref 70–99)
Potassium: 4.5 mmol/L (ref 3.5–5.1)
Sodium: 137 mmol/L (ref 135–145)

## 2021-05-13 MED ORDER — METOPROLOL SUCCINATE ER 50 MG PO TB24
50.0000 mg | ORAL_TABLET | Freq: Every day | ORAL | 3 refills | Status: DC
Start: 2021-05-13 — End: 2021-08-02
  Filled 2021-05-13: qty 90, 90d supply, fill #0

## 2021-05-13 MED ORDER — SPIRONOLACTONE 25 MG PO TABS
ORAL_TABLET | Freq: Every day | ORAL | 3 refills | Status: DC
  Filled 2021-05-13: qty 90, fill #0
  Filled 2021-09-08: qty 30, 30d supply, fill #0

## 2021-05-13 MED ORDER — SACUBITRIL-VALSARTAN 49-51 MG PO TABS
1.0000 | ORAL_TABLET | Freq: Two times a day (BID) | ORAL | 3 refills | Status: DC
  Filled 2021-05-13: qty 180, 90d supply, fill #0
  Filled 2021-08-04: qty 60, 30d supply, fill #0
  Filled 2021-09-08: qty 60, 30d supply, fill #1

## 2021-05-13 NOTE — Patient Instructions (Signed)
Continue weighing daily and call for an overnight weight gain of > 2 pounds or a weekly weight gain of >5 pounds. 

## 2021-05-14 ENCOUNTER — Telehealth: Payer: Self-pay

## 2021-05-14 ENCOUNTER — Other Ambulatory Visit: Payer: Self-pay

## 2021-05-14 NOTE — Telephone Encounter (Signed)
Patient called to inform him of normal lab results. Patient reminded to call if he had any questions or concerns about medications or HF symptoms. Patient acknowledged.  Suanne Marker, RN [facility]

## 2021-05-15 ENCOUNTER — Telehealth: Payer: Self-pay | Admitting: Gerontology

## 2021-05-15 NOTE — Telephone Encounter (Signed)
-----   Message from Elberta Fortis sent at 05/07/2021  1:43 PM EDT ----- Hello all,  Please call patient to inform and review application process.    Vonte' ----- Message ----- From: Maree Krabbe, LCSW Sent: 04/30/2021  12:29 PM EDT To: Elberta Fortis  Active Problems:   AKI (acute kidney injury) (HCC)   Substance abuse (HCC)   Chest pain   Hypertensive urgency   Nausea and vomiting   Acute CHF (congestive heart failure) (HCC)   PT needs PCP, no insurance Thanks!

## 2021-05-22 ENCOUNTER — Other Ambulatory Visit: Payer: Self-pay

## 2021-05-28 ENCOUNTER — Other Ambulatory Visit: Payer: Self-pay

## 2021-06-13 ENCOUNTER — Ambulatory Visit: Payer: Self-pay | Admitting: Family

## 2021-06-13 ENCOUNTER — Telehealth: Payer: Self-pay | Admitting: Family

## 2021-06-13 NOTE — Telephone Encounter (Signed)
Patient did not show for his [facility] appointment on 06/13/21. Will attempt to reschedule.   

## 2021-07-03 ENCOUNTER — Other Ambulatory Visit: Payer: Self-pay

## 2021-08-02 ENCOUNTER — Emergency Department
Admission: EM | Admit: 2021-08-02 | Discharge: 2021-08-02 | Disposition: A | Discharge status: Home / Self Care | Payer: Self-pay | Attending: Emergency Medicine | Admitting: Emergency Medicine

## 2021-08-02 ENCOUNTER — Other Ambulatory Visit: Payer: Self-pay

## 2021-08-02 ENCOUNTER — Emergency Department: Payer: Self-pay

## 2021-08-02 DIAGNOSIS — Z760 Encounter for issue of repeat prescription: Secondary | ICD-10-CM | POA: Insufficient documentation

## 2021-08-02 DIAGNOSIS — R079 Chest pain, unspecified: Secondary | ICD-10-CM | POA: Insufficient documentation

## 2021-08-02 DIAGNOSIS — U071 COVID-19: Secondary | ICD-10-CM | POA: Insufficient documentation

## 2021-08-02 DIAGNOSIS — Z79899 Other long term (current) drug therapy: Secondary | ICD-10-CM | POA: Insufficient documentation

## 2021-08-02 DIAGNOSIS — F17210 Nicotine dependence, cigarettes, uncomplicated: Secondary | ICD-10-CM | POA: Insufficient documentation

## 2021-08-02 DIAGNOSIS — I509 Heart failure, unspecified: Secondary | ICD-10-CM | POA: Insufficient documentation

## 2021-08-02 DIAGNOSIS — I110 Hypertensive heart disease with heart failure: Secondary | ICD-10-CM | POA: Insufficient documentation

## 2021-08-02 LAB — BASIC METABOLIC PANEL
Anion gap: 10 (ref 5–15)
BUN: 13 mg/dL (ref 6–20)
CO2: 25 mmol/L (ref 22–32)
Calcium: 9.3 mg/dL (ref 8.9–10.3)
Chloride: 103 mmol/L (ref 98–111)
Creatinine, Ser: 1.43 mg/dL — ABNORMAL HIGH (ref 0.61–1.24)
GFR, Estimated: >60 mL/min (ref >60)
Glucose, Bld: 95 mg/dL (ref 70–99)
Potassium: 3.9 mmol/L (ref 3.5–5.1)
Sodium: 138 mmol/L (ref 135–145)

## 2021-08-02 LAB — CBC
HCT: 46.4 % (ref 39.0–52.0)
Hemoglobin: 15.7 g/dL (ref 13.0–17.0)
MCH: 31.8 pg (ref 26.0–34.0)
MCHC: 33.8 g/dL (ref 30.0–36.0)
MCV: 93.9 fL (ref 80.0–100.0)
Platelets: 326 10*3/uL (ref 150–400)
RBC: 4.94 MIL/uL (ref 4.22–5.81)
RDW: 14.6 % (ref 11.5–15.5)
WBC: 4.4 10*3/uL (ref 4.0–10.5)
nRBC: 0 % (ref 0.0–0.2)

## 2021-08-02 LAB — RESP PANEL BY RT-PCR (FLU A&B, COVID) ARPGX2
Influenza A by PCR: NEGATIVE
Influenza B by PCR: NEGATIVE
SARS Coronavirus 2 by RT PCR: POSITIVE — AB

## 2021-08-02 LAB — TROPONIN I (HIGH SENSITIVITY)
Troponin I (High Sensitivity): 23 ng/L — ABNORMAL HIGH (ref <18)
Troponin I (High Sensitivity): 22 ng/L — ABNORMAL HIGH (ref <18)

## 2021-08-02 LAB — BRAIN NATRIURETIC PEPTIDE: B Natriuretic Peptide: 72 pg/mL (ref 0.0–100.0)

## 2021-08-02 MED ORDER — METOPROLOL SUCCINATE ER 50 MG PO TB24: 50.0000 mg | ORAL_TABLET | Freq: Every day | ORAL | 0 refills | Status: DC

## 2021-08-02 MED ORDER — ENTRESTO 49-51 MG PO TABS: 1.0000 | ORAL_TABLET | Freq: Two times a day (BID) | ORAL | 0 refills | Status: DC

## 2021-08-02 MED ORDER — METOPROLOL SUCCINATE ER 50 MG PO TB24
50.0000 mg | ORAL_TABLET | Freq: Every day | ORAL | Status: DC
  Administered 2021-08-02: 50 mg via ORAL
  Filled 2021-08-02: qty 1

## 2021-08-02 MED ORDER — ASPIRIN 81 MG PO CHEW
324.0000 mg | CHEWABLE_TABLET | Freq: Once | ORAL | Status: AC
Start: 2021-08-02 — End: 2021-08-02
  Administered 2021-08-02: 324 mg via ORAL
  Filled 2021-08-02: qty 4

## 2021-08-02 MED ORDER — NIRMATRELVIR/RITONAVIR (PAXLOVID)TABLET: 3.0000 | ORAL_TABLET | Freq: Two times a day (BID) | ORAL | 0 refills | Status: DC

## 2021-08-02 MED ORDER — SACUBITRIL-VALSARTAN 49-51 MG PO TABS
1.0000 | ORAL_TABLET | Freq: Two times a day (BID) | ORAL | Status: DC
  Administered 2021-08-02: 1 via ORAL
  Filled 2021-08-02: qty 1

## 2021-08-02 MED ORDER — NIRMATRELVIR/RITONAVIR (PAXLOVID)TABLET: 3.0000 | ORAL_TABLET | Freq: Two times a day (BID) | ORAL | 0 refills | Status: AC

## 2021-08-02 MED ORDER — IPRATROPIUM-ALBUTEROL 0.5-2.5 (3) MG/3ML IN SOLN
9.0000 mL | Freq: Once | RESPIRATORY_TRACT | Status: AC
  Administered 2021-08-02: 9 mL via RESPIRATORY_TRACT
  Filled 2021-08-02: qty 9

## 2021-08-02 MED ORDER — ENTRESTO 49-51 MG PO TABS: 1.0000 | ORAL_TABLET | Freq: Two times a day (BID) | ORAL | 0 refills | Status: AC

## 2021-08-02 NOTE — ED Notes (Signed)
Pt endorsed CP with coughing and deep inspiration.

## 2021-08-02 NOTE — ED Provider Triage Note (Signed)
Emergency Medicine Provider Triage Evaluation Note  Andrew Walters , a 41 y.o. male  was evaluated in triage.  Pt complains of chest pain all over, shortness of breath.  History of congestive heart failure.  Review of Systems  Positive: Chest pain, shortness of breath, symptoms all day Negative: Fever, chills  Physical Exam  There were no vitals taken for this visit. Gen:   Awake, no distress   Resp:  Normal effort  MSK:   Moves extremities without difficulty  Other:    Medical Decision Making  Medically screening exam initiated at 2:08 PM.  Appropriate orders placed.  Andrew Walters was informed that the remainder of the evaluation will be completed by another provider, this initial triage assessment does not replace that evaluation, and the importance of remaining in the ED until their evaluation is complete.     Versie Starks, PA-C 08/02/21 1409

## 2021-08-02 NOTE — ED Provider Notes (Signed)
[facility] Provider Note    Event Date/Time   First MD Initiated Contact with Patient 08/02/21 1745     (approximate)   History   Chest Pain   HPI  Bamidele Villasenor is a 41 y.o. male with past medical history of CHF with most recent echo in 04/29/2021 markable for an EF of 25 to 30% with severely decreased left ventricular function and global hypokinesis and hypertension currently prescribed metoprolol and Entresto and tobacco abuse who presents for assessment of chest pain, cough, shortness of breath myalgias and decreased appetite since yesterday.  Patient denies any abdominal pain, back pain, vomiting, diarrhea, urinary symptoms, headache or earache, sore throat, fevers or any illicit drug use or EtOH use.  States he smokes a couple cigarettes per day.  States he has been out of his heart failure and blood pressure medicines for about 2 weeks.  Denies any other acute concerns at this time.      Physical Exam  Triage Vital Signs: ED Triage Vitals  Enc Vitals Group     BP 08/02/21 1412 (!) 171/115     Pulse Rate 08/02/21 1412 96     Resp 08/02/21 1412 19     Temp 08/02/21 1412 98.7 F (37.1 C)     Temp Source 08/02/21 1412 Oral     SpO2 08/02/21 1412 94 %     Weight 08/02/21 1410 205 lb (93 kg)     Height 08/02/21 1410 5\' 11"  (1.803 m)     Head Circumference --      Peak Flow --      Pain Score 08/02/21 1410 8     Pain Loc --      Pain Edu? --      Excl. in [place]? --     Most recent vital signs: Vitals:   08/02/21 1412 08/02/21 1850  BP: (!) 171/115 (!) 143/96  Pulse: 96   Resp: 19   Temp: 98.7 F (37.1 C)   SpO2: 94% 94%    General: Awake, no distress.  CV:  Good peripheral perfusion.  2+ radial pulses.  No significant murmurs at this time. Resp:  Normal effort.  Very slight end expiratory wheeze bilaterally.  Patient is not tachypneic and has no significant respiratory distress or accessory muscle use on exam. Abd:  No distention.  Soft  throughout   ED Results / Procedures / Treatments  Labs (all labs ordered are listed, but only abnormal results are displayed) Labs Reviewed  RESP PANEL BY RT-PCR (FLU A&B, COVID) ARPGX2 - Abnormal; Notable for the following components:      Result Value   SARS Coronavirus 2 by RT PCR POSITIVE (*)    All other components within normal limits  BASIC METABOLIC PANEL - Abnormal; Notable for the following components:   Creatinine, Ser 1.43 (*)    All other components within normal limits  TROPONIN I (HIGH SENSITIVITY) - Abnormal; Notable for the following components:   Troponin I (High Sensitivity) 23 (*)    All other components within normal limits  CBC  BRAIN NATRIURETIC PEPTIDE  TROPONIN I (HIGH SENSITIVITY)     EKG  ECG remarkable sinus rhythm with a ventricular rate of 95, incomplete right bundle branch block and T wave inversion in lead III and V6 without other clear evidence of acute ischemia or significant arrhythmia.   RADIOLOGY  Chest x-ray reviewed by myself shows no clear focal consolidation, effusion, edema, pneumothorax or other clear acute process.  There is evidence of mild cardiomegaly.  I also reviewed radiology interpretation and agree with their findings.    PROCEDURES:  Critical Care performed: No  Procedures    MEDICATIONS ORDERED IN ED: Medications  metoprolol succinate (TOPROL-XL) 24 hr tablet 50 mg (has no administration in time range)  sacubitril-valsartan (ENTRESTO) 49-51 mg per tablet (has no administration in time range)  aspirin chewable tablet 324 mg (324 mg Oral Given 08/02/21 1854)  ipratropium-albuterol (DUONEB) 0.5-2.5 (3) MG/3ML nebulizer solution 9 mL (9 mLs Nebulization Given 08/02/21 1850)     IMPRESSION / MDM / ASSESSMENT AND PLAN / ED COURSE  I reviewed the triage vital signs and the nursing notes.                              Differential diagnosis includes, but is not limited to ACS, bronchitis, pneumonia, pericarditis,  myocarditis, pleurisy and chest wall inflammation.  He describes his chest pain is pressure primarily.  Tussive without any back pain Evalose suspicion for dissection at this time.  He is not hypoxic, tachypneic or tachycardic and I have low suspicion for PE at this time.  ECG remarkable sinus rhythm with a ventricular rate of 95, incomplete right bundle branch block and T wave inversion in lead III and V6 without other clear evidence of acute ischemia or significant arrhythmia.  Initial troponin is elevated 23.  However on review of prior records including troponins obtained 3 months ago and they were 65 and 56 this is much lower today.  I suspect some possible mild demand ischemia in the setting of acute COVID infection as his COVID PCR is positive.  However I will plan to trend this and give ASA in the meantime.  It is also possible he has some mild troponinemia from his CKD as his BMP today is remarkable for creatinine of 1.43 compared to 1.282 months ago.  No other significant electrolyte or metabolic derangements today.  BNP is 72 and overall patient does not appear volume overloaded.  Chest x-ray reviewed by myself shows no clear focal consolidation, effusion, edema, pneumothorax or other clear acute process.  There is evidence of mild cardiomegaly.  I also reviewed radiology interpretation and agree with their findings.  All home blood pressure medicines were ordered and patient given a breathing treatment.  Care patient signed over to assuming provider at approximately 7 PM.  Plan is to follow-up repeat troponin and if stable or downtrending and patient is feeling little better on my reassessment with continued stable vitals I think you will be stable for discharge with outpatient follow-up.  I will write Rx for refills of his medications and course of Paxil bid.  I did discuss with the patient importance of tobacco cessation and close outpatient PCP follow-up.      FINAL CLINICAL IMPRESSION(S)  / ED DIAGNOSES   Final diagnoses:  Chest pain, unspecified type  COVID  Medication refill     Rx / DC Orders   ED Discharge Orders          Ordered    metoprolol succinate (TOPROL-XL) 50 MG 24 hr tablet  Daily        08/02/21 1826    sacubitril-valsartan (ENTRESTO) 49-51 MG  2 times daily        08/02/21 1846    nirmatrelvir/ritonavir EUA (PAXLOVID) 20 x 150 MG & 10 x 100MG  TABS  2 times daily  08/02/21 1847             Note:  This document was prepared using Dragon voice recognition software and may include unintentional dictation errors.   Lucrezia Starch, MD 08/02/21 [phone]

## 2021-08-04 ENCOUNTER — Other Ambulatory Visit: Payer: Self-pay

## 2021-08-04 MED ORDER — METOPROLOL SUCCINATE ER 50 MG PO TB24
ORAL_TABLET | ORAL | 0 refills | Status: DC
  Filled 2021-08-04: qty 30, 30d supply, fill #0

## 2021-08-19 ENCOUNTER — Other Ambulatory Visit: Payer: Self-pay

## 2021-08-29 ENCOUNTER — Telehealth: Payer: Self-pay | Admitting: Pharmacist

## 2021-08-29 NOTE — Telephone Encounter (Signed)
Patient failed to provide requested 2023 financial documentation. No additional medication assistance will be provided by [facility] without the required proof of income documentation. Patient notified by letter. ? ?Andrew Walters ?[facility] ?

## 2021-09-08 ENCOUNTER — Other Ambulatory Visit: Payer: Self-pay | Admitting: Family

## 2021-09-08 ENCOUNTER — Other Ambulatory Visit: Payer: Self-pay

## 2021-09-09 ENCOUNTER — Other Ambulatory Visit: Payer: Self-pay

## 2021-09-10 ENCOUNTER — Other Ambulatory Visit: Payer: Self-pay

## 2021-09-12 ENCOUNTER — Telehealth: Payer: Self-pay | Admitting: Pharmacy Technician

## 2021-09-12 NOTE — Telephone Encounter (Signed)
Patient failed to provide 2023 proof of income.  No additional medication assistance will be provided by [facility] without the required proof of income documentation.  Patient notified by letter.  Sherilyn Dacosta Care Manager [facility]    [address], [place]  [address]  August 29, 2021    Dear Andrew Walters:  This is to inform you that you are no longer eligible to receive medication assistance at [facility].  The reason(s) are:    _____Your total gross monthly household income exceeds 300% of the Federal Poverty Level.   _____Tangible assets (savings, checking, stocks/bonds, pension, retirement, etc.) exceeds our limit  _____You are eligible to receive benefits from [facility], [facility] or HIV Medication              Assistance Program _____You are eligible to receive benefits from a Medicare Part D plan _____You have prescription insurance  _____You are not an [facility] resident __X__Failure to provide all requested documentation (proof of income for 2023, and/or Patient Intake Application, DOH Attestation, Contract, etc).    Medication assistance will resume once all requested documentation has been returned to our clinic.  If you have questions, please contact our clinic at [phone].    Thank you,  [facility]

## 2021-09-18 ENCOUNTER — Encounter: Payer: Self-pay | Admitting: Family

## 2021-09-18 ENCOUNTER — Other Ambulatory Visit: Payer: Self-pay | Admitting: Family

## 2021-09-18 ENCOUNTER — Ambulatory Visit: Payer: Self-pay | Attending: Family | Admitting: Family

## 2021-09-18 ENCOUNTER — Other Ambulatory Visit: Payer: Self-pay

## 2021-09-18 VITALS — BP 168/110 | HR 95 | Resp 16 | Ht 71.0 in | Wt 223.1 lb

## 2021-09-18 DIAGNOSIS — F17210 Nicotine dependence, cigarettes, uncomplicated: Secondary | ICD-10-CM | POA: Insufficient documentation

## 2021-09-18 DIAGNOSIS — R0602 Shortness of breath: Secondary | ICD-10-CM | POA: Insufficient documentation

## 2021-09-18 DIAGNOSIS — Z635 Disruption of family by separation and divorce: Secondary | ICD-10-CM | POA: Insufficient documentation

## 2021-09-18 DIAGNOSIS — R42 Dizziness and giddiness: Secondary | ICD-10-CM | POA: Insufficient documentation

## 2021-09-18 DIAGNOSIS — I10 Essential (primary) hypertension: Secondary | ICD-10-CM

## 2021-09-18 DIAGNOSIS — Z7901 Long term (current) use of anticoagulants: Secondary | ICD-10-CM | POA: Insufficient documentation

## 2021-09-18 DIAGNOSIS — Z8616 Personal history of COVID-19: Secondary | ICD-10-CM | POA: Insufficient documentation

## 2021-09-18 DIAGNOSIS — R319 Hematuria, unspecified: Secondary | ICD-10-CM | POA: Insufficient documentation

## 2021-09-18 DIAGNOSIS — F329 Major depressive disorder, single episode, unspecified: Secondary | ICD-10-CM

## 2021-09-18 DIAGNOSIS — Z79899 Other long term (current) drug therapy: Secondary | ICD-10-CM | POA: Insufficient documentation

## 2021-09-18 DIAGNOSIS — R059 Cough, unspecified: Secondary | ICD-10-CM | POA: Insufficient documentation

## 2021-09-18 DIAGNOSIS — R062 Wheezing: Secondary | ICD-10-CM | POA: Insufficient documentation

## 2021-09-18 DIAGNOSIS — Z09 Encounter for follow-up examination after completed treatment for conditions other than malignant neoplasm: Secondary | ICD-10-CM | POA: Insufficient documentation

## 2021-09-18 DIAGNOSIS — F1910 Other psychoactive substance abuse, uncomplicated: Secondary | ICD-10-CM

## 2021-09-18 DIAGNOSIS — Z7984 Long term (current) use of oral hypoglycemic drugs: Secondary | ICD-10-CM | POA: Insufficient documentation

## 2021-09-18 DIAGNOSIS — I110 Hypertensive heart disease with heart failure: Secondary | ICD-10-CM | POA: Insufficient documentation

## 2021-09-18 DIAGNOSIS — I5022 Chronic systolic (congestive) heart failure: Secondary | ICD-10-CM | POA: Insufficient documentation

## 2021-09-18 MED ORDER — METOPROLOL SUCCINATE ER 50 MG PO TB24: 50.0000 mg | ORAL_TABLET | Freq: Every day | ORAL | 3 refills | Status: DC

## 2021-09-18 MED ORDER — EMPAGLIFLOZIN 10 MG PO TABS
10.0000 mg | ORAL_TABLET | Freq: Every day | ORAL | 3 refills | Status: DC
  Filled 2021-09-18: qty 90, 90d supply, fill #0

## 2021-09-18 MED ORDER — SACUBITRIL-VALSARTAN 49-51 MG PO TABS: 1.0000 | ORAL_TABLET | Freq: Two times a day (BID) | ORAL | 5 refills | Status: DC

## 2021-09-18 MED ORDER — METOPROLOL SUCCINATE ER 50 MG PO TB24
50.0000 mg | ORAL_TABLET | Freq: Every day | ORAL | 3 refills | Status: DC
  Filled 2021-09-18: qty 90, 90d supply, fill #0

## 2021-09-18 MED ORDER — EMPAGLIFLOZIN 10 MG PO TABS: 10.0000 mg | ORAL_TABLET | Freq: Every day | ORAL | 5 refills | Status: DC

## 2021-09-18 MED ORDER — SPIRONOLACTONE 25 MG PO TABS: ORAL_TABLET | Freq: Every day | ORAL | 3 refills | Status: DC

## 2021-09-18 NOTE — Patient Instructions (Addendum)
Continue weighing daily and call for an overnight weight gain of 3 pounds or more or a weekly weight gain of more than 5 pounds.   [facility] (primary care)  [phone] [address] [place] [address]   Begin taking jardiance as 1 tablet every morning. Take the coupon to [facility] and you will get the first month free. After that, you will get the medicine from [facility] clinic.   [facility] to [facility] today and pick up your metoprolol

## 2021-09-18 NOTE — Progress Notes (Signed)
Patient called saying that he couldn't get his medications from [facility] clinic as he has to finish the paperwork process. He asks that all medications get sent to [facility].

## 2021-09-18 NOTE — Progress Notes (Signed)
[facility] - HEART FAILURE CLINIC - PHARMACIST COUNSELING NOTE  Guideline-Directed Medical Therapy/Evidence Based Medicine  ACE/ARB/ARNI: Sacubitril-valsartan 49-51 mg twice daily Beta Blocker: Metoprolol succinate 50 mg daily Aldosterone Antagonist: Spironolactone 25 mg daily Diuretic:  N/A SGLT2i:  N/A  Adherence Assessment  Do you ever forget to take your medication? [] Yes [x] No  Do you ever skip doses due to side effects? [] Yes [x] No  Do you have trouble affording your medicines? [] Yes [x] No  Are you ever unable to pick up your medication due to transportation difficulties? [x] Yes [] No  Do you ever stop taking your medications because you don't believe they are helping? [] Yes [x] No  Do you check your weight daily? [] Yes [x] No    Adherence strategy: occasionally misses a dose from being busy with kids, no pill box but remembers to take them (only 3 meds)  Barriers to obtaining medications: says cost is an issue but fills medications at medication management via prescription savings program.  Vitals with BMI 09/18/2021 08/02/2021 08/02/2021  Height 5\' 11"  - -  Weight 223 lbs 1 oz - -  BMI 31.12 - -  Systolic 168 148  Diastolic 110 91 97  Pulse 95 86 97    Vital signs: HR 95, BP 168/110, weight (pounds) 223  ECHO: Date 04/29/21, EF of 25-30%, notes mild LVH, global hypokinesis severe LAE and mild MR.  Recent ED Visit (past 6 months):   Admitted 04/28/21 due to chest pain and shortness of breath. Cardiology consult obtained. Elevated troponin thought to be due to demand ischemia. Chest CTA negative for PE. HTN meds started. Discharged after 4 days.   Admitted 08/02/21 due to chest pain, cough, shortness of breath myalgias and decreased appetite x1 days. States he was out of his HF/HTN medicines x2 weeks.  Denies any other acute concerns at this time. Diagnosed with mild demand ischemia in the setting of acute COVID infection and discharged with Paxlovid  same day.    BMP Latest Ref Rng & Units 08/02/2021 05/13/2021 05/02/2021  Glucose 70 - 99 mg/dL 95 89 )  BUN 6 - 20 mg/dL 13 13 17   Creatinine 0.61 - 1.24 mg/dL ) 09/20/2021) 09/30/2021  Sodium 135 - 145 mmol/L 138 137 134(L)  Potassium 3.5 - 5.1 mmol/L 3.9 4.5 3.7  Chloride 98 - 111 mmol/L 103 104 101  CO2 22 - 32 mmol/L 25 25 25   Calcium 8.9 - 10.3 mg/dL 9.3 9.3 9.4    Past Medical History:  Diagnosis Date   CHF (congestive heart failure) (HCC)    Hypertension     ASSESSMENT 41 year old male with PMH tobacco abuse, HFrEF, and HTN presents to the HF clinic for follow up. Patient lat seen by , NP on 05/13/21 where dizzinesss, fatigue, SOB, intermittent chest pain and leg arthralgias. Also positive for depression. Patient reports frequently consuming caffeine and fast food with excessive salt intake. Also weight increased from 205 lbs to 223 lbs with no reported swelling since ED visit on 08/02/21. Today patient's BP elevated and aptient reports not taking their medications this morning. Patient reports being out of refills for metoprolol succinate for past 3 weeks.   PLAN Add Jardiance for GDMT in HFrEF Provide refills for toprol xl today and may consider titration of GDMT at next visit Provided education on SGLT2i and pathophysiology benefit with HFrEF  Time spent: 30 minutes  06/28/21, Pharm.D. Clinical Pharmacist 09/18/2021 11:39 AM   Current Outpatient Medications:    empagliflozin (  JARDIANCE) 10 MG TABS tablet, Take 1 tablet (10 mg total) by mouth daily before breakfast., Disp: 30 tablet, Rfl: 5   empagliflozin (JARDIANCE) 10 MG TABS tablet, Take 1 tablet (10 mg total) by mouth daily before breakfast., Disp: 90 tablet, Rfl: 3   metoprolol succinate (TOPROL-XL) 50 MG 24 hr tablet, Take 1 tablet (50 mg total) by mouth once daily. Take with or immediately following a meal., Disp: 90 tablet, Rfl: 3   sacubitril-valsartan (ENTRESTO) 49-51 MG, Take 1 tablet by  mouth 2 (two) times daily., Disp: 180 tablet, Rfl: 3   spironolactone (ALDACTONE) 25 MG tablet, TAKE ONE TABLET BY MOUTH ONCE DAILY, Disp: 90 tablet, Rfl: 3   MEDICATION ADHERENCES TIPS AND STRATEGIES Taking medication as prescribed improves patient outcomes in heart failure (reduces hospitalizations, improves symptoms, increases survival) Side effects of medications can be managed by decreasing doses, switching agents, stopping drugs, or adding additional therapy. Please let someone in the Heart Failure Clinic know if you have having bothersome side effects so we can modify your regimen. Do not alter your medication regimen without talking to [place].  Medication reminders can help patients remember to take drugs on time. If you are missing or forgetting doses you can try linking behaviors, using pill boxes, or an electronic reminder like an alarm on your phone or an app. Some people can also get automated phone calls as medication reminders.

## 2021-09-18 NOTE — Progress Notes (Signed)
Patient ID: Andrew Walters, male    DOB: 03/22/1981, 41 y.o.   MRN: [id number]  HPI  Andrew Walters is Walters 41 y/o male with Walters history of HTN, current tobacco use and chronic heart failure.   Echo report from 04/29/21 reviewed and showed an EF of 25-30% along with mild LVH, severe LAE and mild Andrew.   Was in the ED 08/02/21 due to chest pain after being out of medications for ~ 2 weeks. Tested + covid. Evaluated and released. Admitted 04/28/21 due to chest pain and shortness of breath. Cardiology consult obtained. Elevated troponin thought to be due to demand ischemia. Chest CTA negative for PE. HTN meds started. Discharged after 4 days.   He presents today for Walters follow-up visit with Walters chief complaint of minimal shortness of breath upon moderate exertion. He describes this as chronic. He has associated cough, wheezing, dizziness, depression, anxiety and intermittent hematuria along with this. He denies any difficulty sleeping, abdominal distention, palpitations, pedal edema, chest pain, fatigue or weight gain.   Did not take his medications yet today and has been out of metoprolol for Walters few days and needs Walters new RX.   Still under considerable stress as he's going through Walters divorce and his kids have been sick. He's also quite concerned about the intermittent hematuria that he's experiencing. He denies any unprotected sexual contacts, fever or flank pain.   Did not get any medication insurance yet.   Past Medical History:  Diagnosis Date   CHF (congestive heart failure) ([place])    Hypertension    No past surgical history on file. No family history on file. Social History   Tobacco Use   Smoking status: Every Day   Smokeless tobacco: Never  Substance Use Topics   Alcohol use: No   No Known Allergies  Prior to Admission medications   Medication Sig Start Date End Date Taking? Authorizing Provider  sacubitril-valsartan (ENTRESTO) 49-51 MG Take 1 tablet by mouth 2 (two) times daily. 05/13/21 10/08/21 Yes  Andrew Price A, FNP  spironolactone (ALDACTONE) 25 MG tablet TAKE ONE TABLET BY MOUTH ONCE DAILY 05/13/21  Yes Andrew Price A, FNP  metoprolol succinate (TOPROL-XL) 50 MG 24 hr tablet TAKE 1 TABLET BY MOUTH ONCE DAILY. TAKE WITH OR IMMEDIATELY FOLLOWING Walters MEAL. Patient not taking: Reported on 09/18/2021 08/02/21   Cuthriell, Charline Bills, PA-C    Review of Systems  Constitutional:  Negative for appetite change, fatigue and fever.  HENT:  Negative for congestion, postnasal drip and sore throat.   Eyes: Negative.   Respiratory:  Positive for cough, shortness of breath and wheezing.   Cardiovascular:  Negative for chest pain, palpitations and leg swelling.  Gastrointestinal:  Negative for abdominal distention.  Endocrine: Negative.   Genitourinary:  Positive for hematuria. Negative for flank pain.  Musculoskeletal:  Positive for arthralgias (legs hurt at times). Negative for back pain.  Skin: Negative.   Allergic/Immunologic: Negative.   Neurological:  Positive for dizziness. Negative for light-headedness.  Hematological:  Negative for adenopathy. Does not bruise/bleed easily.  Psychiatric/Behavioral:  Positive for dysphoric mood. Negative for sleep disturbance (sleeping on 1 pillow). The patient is not nervous/anxious.    Vitals:   09/18/21 1022  BP: (!) 168/110  Pulse: 95  Resp: 16  SpO2: 98%  Weight: 223 lb 1 oz (101.2 kg)  Height: 5\' 11"  (1.803 m)   Wt Readings from Last 3 Encounters:  09/18/21 223 lb 1 oz (101.2 kg)  08/02/21 205 lb (93  kg)  05/13/21 232 lb (105.2 kg)   Lab Results  Component Value Date   CREATININE 1.43 (H) 08/02/2021   CREATININE 1.28 (H) 05/13/2021   CREATININE 1.15 05/02/2021    Physical Exam Vitals and nursing note reviewed.  Constitutional:      Appearance: Normal appearance.  HENT:     Head: Normocephalic and atraumatic.  Cardiovascular:     Rate and Rhythm: Normal rate and regular rhythm.  Pulmonary:     Effort: Pulmonary effort is normal.  No respiratory distress.     Breath sounds: No wheezing, rhonchi or rales.  Abdominal:     General: There is no distension.     Palpations: Abdomen is soft.  Musculoskeletal:        General: No tenderness.     Cervical back: Normal range of motion and neck supple.     Right lower leg: No edema.     Left lower leg: No edema.  Skin:    General: Skin is warm and dry.  Neurological:     General: No focal deficit present.     Mental Status: He is alert and oriented to person, place, and time.  Psychiatric:        Mood and Affect: Mood is depressed. Mood is not anxious.        Behavior: Behavior normal.        Thought Content: Thought content normal.    Assessment & Plan:  1: Chronic heart failure with reduced ejection fraction- - NYHA class II - euvolemic today - weighing daily; reminded to call for an overnight weight gain of > 2 pounds or Walters weekly weight gain of >5 pounds - weight down 9 pounds from last visit here 4 months ago - not adding salt - has seen cardiology Andrew Walters) in the past - on GDMT of metoprolol, entresto and spironolactone - will add jardiance 10mg  daily; 30 day voucher provided and RX sent to [facility]; [facility] after the 30 day voucher - metoprolol refilled today - will check labs at next visit - BNP 04/28/21 was 396.2 - PharmD reconciled medications with the patient  2: HTN- - BP elevated (168/110) but he hasn't taken any of his medications yet today - BMP 05/02/21 reviewed and showed sodium 134, potassium 3.7, creatinine 1.15 and GFR >60 - does not have any medical insurance so [facility] information provided  3: Substance use- - UDS + for marijuana 04/29/21 - smokes 1 cigarette daily  4: Depression/ stress- - endorses Walters lot of stressors that he knows are affecting his heart - currently going through Walters divorce - unable to work and would like to pursue disability; advised to go to social services for further  direction  5: Hematuria- - denies fever, flank pain or consistent hematuria - explained this could be attributed to Walters number of different reasons but that he needed to get established with [facility] and in the interm, go to an Urgent Care for further evaluation and to have the urine checked   Medication bottles reviewed.   Return in 1 month, sooner if needed.    Medication bottles reviewed.

## 2021-09-22 ENCOUNTER — Ambulatory Visit: Payer: Self-pay

## 2021-09-23 ENCOUNTER — Ambulatory Visit: Payer: Self-pay

## 2021-10-07 ENCOUNTER — Other Ambulatory Visit: Payer: Self-pay

## 2021-10-07 ENCOUNTER — Ambulatory Visit: Payer: Self-pay | Admitting: Pharmacy Technician

## 2021-10-07 NOTE — Progress Notes (Signed)
Patient received a 30 day supply of medication.  Patient has been scheduled twice for an eligibility appointment for assistance with the completion of paperwork.  Patient failed to show for either appointment.  [facility] must receive requested financial documentation in order to determine eligibility and provide additional medication assistance. ? ?Jacquelynn Cree ?Care Manager ?[facility]  ? ? ? ?[address] ?[place], [place]  [address] ? ?August 29, 2021 ? ? ? ?Dear Andrew Walters: ? ?This is to inform you that you are no longer eligible to receive medication assistance at [facility].  The reason(s) are:   ? ?_____Your total gross monthly household income exceeds 300% of the Federal Poverty Level.   ?_____Tangible assets (savings, checking, stocks/bonds, pension, retirement, etc.) exceeds our limit  ?_____You are eligible to receive benefits from [facility], [facility] or HIV Medication    ?          Assistance Program ?_____You are eligible to receive benefits from a Medicare Part ?D? plan ?_____You have prescription insurance  ?_____You are not an [facility] resident ?__X__Failure to provide all requested documentation (proof of income for 2023, and/or Patient Intake Application, [place], Contract, etc).   ? ?Medication assistance will resume once all requested documentation has been returned to our clinic.  If you have questions, please contact our clinic at [phone].   ? ?Thank you, ? ?[facility]  ?

## 2021-10-13 ENCOUNTER — Ambulatory Visit: Payer: Self-pay | Admitting: Pharmacy Technician

## 2021-10-13 ENCOUNTER — Other Ambulatory Visit: Payer: Self-pay

## 2021-10-13 ENCOUNTER — Other Ambulatory Visit: Payer: Self-pay | Admitting: Family

## 2021-10-13 DIAGNOSIS — Z79899 Other long term (current) drug therapy: Secondary | ICD-10-CM

## 2021-10-13 MED ORDER — SPIRONOLACTONE 25 MG PO TABS
ORAL_TABLET | Freq: Every day | ORAL | 3 refills | Status: DC
  Filled 2021-10-13: qty 30, 30d supply, fill #0
  Filled 2021-11-18: qty 30, 30d supply, fill #1

## 2021-10-13 MED ORDER — SACUBITRIL-VALSARTAN 49-51 MG PO TABS
1.0000 | ORAL_TABLET | Freq: Two times a day (BID) | ORAL | 3 refills | Status: AC
  Filled 2021-10-13: qty 60, 30d supply, fill #0
  Filled 2021-11-18: qty 180, 90d supply, fill #1
  Filled 2022-04-16: qty 180, 90d supply, fill #0

## 2021-10-13 NOTE — Progress Notes (Signed)
Entresto and spironolactone RX sent to [facility] clinic ?

## 2021-10-14 ENCOUNTER — Other Ambulatory Visit: Payer: Self-pay

## 2021-10-14 NOTE — Progress Notes (Deleted)
? Patient ID: Andrew Walters, male    DOB: 1981/07/22, 41 y.o.   MRN: [id number] ? ?HPI ? ?Andrew Walters is a 41 y/o male with a history of HTN, current tobacco use and chronic heart failure.  ? ?Echo report from 04/29/21 reviewed and showed an EF of 25-30% along with mild LVH, severe LAE and mild Andrew.  ? ?Was in the ED 08/02/21 due to chest pain after being out of medications for ~ 2 weeks. Tested + covid. Evaluated and released. Admitted 04/28/21 due to chest pain and shortness of breath. Cardiology consult obtained. Elevated troponin thought to be due to demand ischemia. Chest CTA negative for PE. HTN meds started. Discharged after 4 days.  ? ?He presents today for a follow-up visit with a chief complaint of  ? ?Past Medical History:  ?Diagnosis Date  ? CHF (congestive heart failure) ([facility])   ? Hypertension   ? ?No past surgical history on file. ?No family history on file. ?Social History  ? ?Tobacco Use  ? Smoking status: Every Day  ? Smokeless tobacco: Never  ?Substance Use Topics  ? Alcohol use: No  ? ?No Known Allergies ? ? ? ?Review of Systems  ?Constitutional:  Negative for appetite change, fatigue and fever.  ?HENT:  Negative for congestion, postnasal drip and sore throat.   ?Eyes: Negative.   ?Respiratory:  Positive for cough, shortness of breath and wheezing.   ?Cardiovascular:  Negative for chest pain, palpitations and leg swelling.  ?Gastrointestinal:  Negative for abdominal distention.  ?Endocrine: Negative.   ?Genitourinary:  Positive for hematuria. Negative for flank pain.  ?Musculoskeletal:  Positive for arthralgias (legs hurt at times). Negative for back pain.  ?Skin: Negative.   ?Allergic/Immunologic: Negative.   ?Neurological:  Positive for dizziness. Negative for light-headedness.  ?Hematological:  Negative for adenopathy. Does not bruise/bleed easily.  ?Psychiatric/Behavioral:  Positive for dysphoric mood. Negative for sleep disturbance (sleeping on 1 pillow). The patient is not nervous/anxious.    ? ? ? ? ?Physical Exam ?Vitals and nursing note reviewed.  ?Constitutional:   ?   Appearance: Normal appearance.  ?HENT:  ?   Head: Normocephalic and atraumatic.  ?Cardiovascular:  ?   Rate and Rhythm: Normal rate and regular rhythm.  ?Pulmonary:  ?   Effort: Pulmonary effort is normal. No respiratory distress.  ?   Breath sounds: No wheezing, rhonchi or rales.  ?Abdominal:  ?   General: There is no distension.  ?   Palpations: Abdomen is soft.  ?Musculoskeletal:     ?   General: No tenderness.  ?   Cervical back: Normal range of motion and neck supple.  ?   Right lower leg: No edema.  ?   Left lower leg: No edema.  ?Skin: ?   General: Skin is warm and dry.  ?Neurological:  ?   General: No focal deficit present.  ?   Mental Status: He is alert and oriented to person, place, and time.  ?Psychiatric:     ?   Mood and Affect: Mood is depressed. Mood is not anxious.     ?   Behavior: Behavior normal.     ?   Thought Content: Thought content normal.  ? ? ?Assessment & Plan: ? ?1: Chronic heart failure with reduced ejection fraction- ?- NYHA class II ?- euvolemic today ?- weighing daily; reminded to call for an overnight weight gain of > 2 pounds or a weekly weight gain of >5 pounds ?- weight 223.1 pounds from  last visit here 1 month ago ?- not adding salt ?- has seen cardiology Andrew Walters) in the past ?- on GDMT of metoprolol, entresto, jardiance and spironolactone ?- will check labs today since jardiance was started at last visit ?- BNP 04/28/21 was 396.2 ?- PharmD reconciled medications with the patient ? ?2: HTN- ?- BP  ?- BMP 08/02/21 reviewed and showed sodium 138, potassium 3.9, creatinine 1.43 and GFR >60 ?- does not have any medical insurance so [facility] information provided ? ?3: Substance use- ?- UDS + for marijuana 04/29/21 ?- smokes 1 cigarette daily ? ?4: Depression/ stress- ?- endorses a lot of stressors that he knows are affecting his heart ?- currently going through a divorce ?- unable to work and would like to  pursue disability; advised to go to social services for further direction ? ?5: Hematuria- ?- denies fever, flank pain or consistent hematuria ?- explained this could be attributed to a number of different reasons but that he needed to get established with [facility] and in the interm, go to an Urgent Care for further evaluation and to have the urine checked ? ? ?Medication bottles reviewed.  ? ?.  ? ?

## 2021-10-14 NOTE — Progress Notes (Signed)
Completed [facility] application and contract.  Patient agreed to all terms of the [facility] contract.   ? ?Patient approved to receive medication assistance at [facility] until time for re-certification in 0175, and as long as eligibility criteria continues to be met.   ? ?Provided patient with community resource material based on his particular needs.   ? ?Sending referral to [facility]. ? ?Jacquelynn Cree ?Care Manager ?[facility] ?

## 2021-10-15 ENCOUNTER — Ambulatory Visit: Payer: Self-pay | Admitting: Family

## 2021-10-16 ENCOUNTER — Other Ambulatory Visit: Payer: Self-pay

## 2021-10-23 ENCOUNTER — Ambulatory Visit: Payer: Self-pay | Admitting: Family

## 2021-10-23 ENCOUNTER — Telehealth: Payer: Self-pay | Admitting: Family

## 2021-10-23 NOTE — Telephone Encounter (Signed)
Patient did not show for his [facility] appointment on 10/23/21. Will attempt to reschedule.   ?

## 2021-10-27 ENCOUNTER — Ambulatory Visit: Payer: Self-pay | Attending: Family | Admitting: Family

## 2021-10-27 ENCOUNTER — Encounter: Payer: Self-pay | Admitting: Family

## 2021-10-27 ENCOUNTER — Other Ambulatory Visit: Payer: Self-pay

## 2021-10-27 ENCOUNTER — Other Ambulatory Visit
Admission: RE | Admit: 2021-10-27 | Discharge: 2021-10-27 | Disposition: A | Discharge status: Home / Self Care | Payer: Self-pay | Source: Ambulatory Visit | Attending: Family | Admitting: Family

## 2021-10-27 VITALS — BP 151/100 | HR 95 | Resp 20 | Ht 71.0 in | Wt 227.2 lb

## 2021-10-27 DIAGNOSIS — I110 Hypertensive heart disease with heart failure: Secondary | ICD-10-CM | POA: Insufficient documentation

## 2021-10-27 DIAGNOSIS — R0602 Shortness of breath: Secondary | ICD-10-CM | POA: Insufficient documentation

## 2021-10-27 DIAGNOSIS — I5022 Chronic systolic (congestive) heart failure: Secondary | ICD-10-CM | POA: Insufficient documentation

## 2021-10-27 DIAGNOSIS — F17210 Nicotine dependence, cigarettes, uncomplicated: Secondary | ICD-10-CM | POA: Insufficient documentation

## 2021-10-27 DIAGNOSIS — Z09 Encounter for follow-up examination after completed treatment for conditions other than malignant neoplasm: Secondary | ICD-10-CM | POA: Insufficient documentation

## 2021-10-27 DIAGNOSIS — Z7901 Long term (current) use of anticoagulants: Secondary | ICD-10-CM | POA: Insufficient documentation

## 2021-10-27 DIAGNOSIS — Z635 Disruption of family by separation and divorce: Secondary | ICD-10-CM | POA: Insufficient documentation

## 2021-10-27 DIAGNOSIS — R42 Dizziness and giddiness: Secondary | ICD-10-CM | POA: Insufficient documentation

## 2021-10-27 DIAGNOSIS — Z7984 Long term (current) use of oral hypoglycemic drugs: Secondary | ICD-10-CM | POA: Insufficient documentation

## 2021-10-27 DIAGNOSIS — Z8616 Personal history of COVID-19: Secondary | ICD-10-CM | POA: Insufficient documentation

## 2021-10-27 DIAGNOSIS — F32A Depression, unspecified: Secondary | ICD-10-CM | POA: Insufficient documentation

## 2021-10-27 DIAGNOSIS — Z79899 Other long term (current) drug therapy: Secondary | ICD-10-CM | POA: Insufficient documentation

## 2021-10-27 DIAGNOSIS — R059 Cough, unspecified: Secondary | ICD-10-CM | POA: Insufficient documentation

## 2021-10-27 DIAGNOSIS — Z720 Tobacco use: Secondary | ICD-10-CM

## 2021-10-27 DIAGNOSIS — F329 Major depressive disorder, single episode, unspecified: Secondary | ICD-10-CM

## 2021-10-27 DIAGNOSIS — I10 Essential (primary) hypertension: Secondary | ICD-10-CM

## 2021-10-27 DIAGNOSIS — R062 Wheezing: Secondary | ICD-10-CM | POA: Insufficient documentation

## 2021-10-27 LAB — BASIC METABOLIC PANEL
Anion gap: 7 (ref 5–15)
BUN: 21 mg/dL — ABNORMAL HIGH (ref 6–20)
CO2: 25 mmol/L (ref 22–32)
Calcium: 9.1 mg/dL (ref 8.9–10.3)
Chloride: 103 mmol/L (ref 98–111)
Creatinine, Ser: 1.34 mg/dL — ABNORMAL HIGH (ref 0.61–1.24)
GFR, Estimated: >60 mL/min (ref >60)
Glucose, Bld: 72 mg/dL (ref 70–99)
Potassium: 4.1 mmol/L (ref 3.5–5.1)
Sodium: 135 mmol/L (ref 135–145)

## 2021-10-27 MED ORDER — METOPROLOL SUCCINATE ER 50 MG PO TB24
50.0000 mg | ORAL_TABLET | Freq: Every day | ORAL | 3 refills | Status: DC
  Filled 2021-10-27 – 2022-01-22 (×2): qty 90, 90d supply, fill #0
  Filled 2022-08-03: qty 90, 90d supply, fill #1

## 2021-10-27 NOTE — Patient Instructions (Addendum)
Continue weighing daily and call for an overnight weight gain of 3 pounds or more or a weekly weight gain of more than 5 pounds. ? ?If you have voicemail, please make sure your mailbox is cleaned out so that we may leave a message and please make sure to listen to any voicemails.  ? ? ?[facility] (primary care) ?[phone] ?[address]  ?[place] [address] ? ? ?Go to [facility] and pick up your metoprolol & ask about entresto and jardiance ?

## 2021-10-27 NOTE — Progress Notes (Signed)
? Patient ID: Andrew Walters, male    DOB: 11/17/1980, 41 y.o.   MRN: [id number] ? ?HPI ? ?Andrew Walters is a 41 y/o male with a history of HTN, current tobacco use and chronic heart failure.  ? ?Echo report from 04/29/21 reviewed and showed an EF of 25-30% along with mild LVH, severe LAE and mild Andrew.  ? ?Was in the ED 08/02/21 due to chest pain after being out of medications for ~ 2 weeks. Tested + covid. Evaluated and released. Admitted 04/28/21 due to chest pain and shortness of breath. Cardiology consult obtained. Elevated troponin thought to be due to demand ischemia. Chest CTA negative for PE. HTN meds started. Discharged after 4 days.  ? ?He presents today for a follow-up visit with a chief complaint of minimal shortness of breath upon moderate exertion. He describes this as chronic in nature. He has associated cough, wheezing, dizziness & depression along with this. He denies any difficulty sleeping, abdominal distention, palpitations, pedal edema, chest pain, fatigue or weight gain.  ? ?He has not taken any of his medications today and has been out jardiance for the last 2 days. Currently has both of his kids now living with him.  ? ?Past Medical History:  ?Diagnosis Date  ? CHF (congestive heart failure) ([place])   ? Hypertension   ? ?No past surgical history on file. ?No family history on file. ?Social History  ? ?Tobacco Use  ? Smoking status: Every Day  ? Smokeless tobacco: Never  ?Substance Use Topics  ? Alcohol use: No  ? ?No Known Allergies ? ?Prior to Admission medications   ?Medication Sig Start Date End Date Taking? Authorizing Provider  ?sacubitril-valsartan (ENTRESTO) 49-51 MG Take 1 tablet by mouth 2 (two) times daily. 10/13/21 11/12/21 Yes Alisa Graff, FNP  ?spironolactone (ALDACTONE) 25 MG tablet TAKE ONE TABLET BY MOUTH ONCE DAILY 10/13/21  Yes Alisa Graff, FNP  ?empagliflozin (JARDIANCE) 10 MG TABS tablet Take 1 tablet (10 mg total) by mouth once daily before breakfast. ?Patient not taking: Reported  on 10/27/2021 09/18/21   Alisa Graff, FNP  ?metoprolol succinate (TOPROL-XL) 50 MG 24 hr tablet Take 1 tablet (50 mg total) by mouth once daily. Take with or immediately following a meal. 10/27/21   Alisa Graff, FNP  ? ? ?Review of Systems  ?Constitutional:  Negative for appetite change, fatigue and fever.  ?HENT:  Negative for congestion, postnasal drip and sore throat.   ?Eyes: Negative.   ?Respiratory:  Positive for cough, shortness of breath and wheezing.   ?Cardiovascular:  Negative for chest pain, palpitations and leg swelling.  ?Gastrointestinal:  Negative for abdominal distention.  ?Endocrine: Negative.   ?Genitourinary: Negative.   ?Musculoskeletal:  Positive for arthralgias (legs hurt at times). Negative for back pain.  ?Skin: Negative.   ?Allergic/Immunologic: Negative.   ?Neurological:  Positive for dizziness. Negative for light-headedness.  ?Hematological:  Negative for adenopathy. Does not bruise/bleed easily.  ?Psychiatric/Behavioral:  Positive for dysphoric mood. Negative for sleep disturbance (sleeping on 1 pillow). The patient is not nervous/anxious.   ? ?Vitals:  ? 10/27/21 1100  ?BP: (!) 151/100  ?Pulse: 95  ?Resp: 20  ?SpO2: 98%  ?Weight: 227 lb 4 oz (103.1 kg)  ?Height: 5\' 11"  (1.803 m)  ? ?Wt Readings from Last 3 Encounters:  ?10/27/21 227 lb 4 oz (103.1 kg)  ?09/18/21 223 lb 1 oz (101.2 kg)  ?08/02/21 205 lb (93 kg)  ? ?Lab Results  ?Component Value Date  ?  CREATININE 1.43 (H) 08/02/2021  ? CREATININE 1.28 (H) 05/13/2021  ? CREATININE 1.15 05/02/2021  ? ? ?Physical Exam ?Vitals and nursing note reviewed.  ?Constitutional:   ?   Appearance: Normal appearance.  ?HENT:  ?   Head: Normocephalic and atraumatic.  ?Cardiovascular:  ?   Rate and Rhythm: Normal rate and regular rhythm.  ?Pulmonary:  ?   Effort: Pulmonary effort is normal. No respiratory distress.  ?   Breath sounds: No wheezing, rhonchi or rales.  ?Abdominal:  ?   General: There is no distension.  ?   Palpations: Abdomen is soft.   ?Musculoskeletal:     ?   General: No tenderness.  ?   Cervical back: Normal range of motion and neck supple.  ?   Right lower leg: No edema.  ?   Left lower leg: No edema.  ?Skin: ?   General: Skin is warm and dry.  ?Neurological:  ?   General: No focal deficit present.  ?   Mental Status: He is alert and oriented to person, place, and time.  ?Psychiatric:     ?   Mood and Affect: Mood is depressed. Mood is not anxious.     ?   Behavior: Behavior normal.     ?   Thought Content: Thought content normal.  ? ? ?Assessment & Plan: ? ?1: Chronic heart failure with reduced ejection fraction- ?- NYHA class II ?- euvolemic today ?- weighing daily; reminded to call for an overnight weight gain of > 2 pounds or a weekly weight gain of >5 pounds ?- weight up 4 pounds from last visit here 1 month ago ?- not adding salt ?- has seen cardiology Humphrey Rolls) in the past ?- on GDMT of metoprolol, entresto, jardiance and spironolactone although has been out of jardiance for the last 2 days and isn't sure he has metoprolol at home ?- 2 weeks jardiance samples given and new RX for [facility]oprolol [facility] to [facility] ?- will check labs today since jardiance was started at last visit ?- BNP 04/28/21 was 396.2 ? ?2: HTN- ?- BP elevated (151/100) but he hasn't taken his meds yet today; did take them while in the office ?- BMP 08/02/21 reviewed and showed sodium 138, potassium 3.9, creatinine 1.43 and GFR >60 ?- does not have any medical insurance so [facility] information provided, again; will also message provider at [facility] to see if they can reach out to him ? ?3: Tobacco use- ?- smokes ~ 1/3 pack of cigarettes daily ? ?4: Depression/ stress- ?- endorses a lot of stressors that he knows are affecting his heart ?- currently going through a divorce and now has both his kids living with him ?- [facility] ? ? ?Medication bottles reviewed.  ? ?Return in 1 month, sooner if needed.  ?

## 2021-10-30 ENCOUNTER — Ambulatory Visit: Payer: Self-pay | Admitting: Gerontology

## 2021-11-03 ENCOUNTER — Telehealth: Payer: Self-pay | Admitting: Pharmacist

## 2021-11-03 NOTE — Telephone Encounter (Signed)
11/03/2021 2:03:30 PM - Andrew Walters enrollment faxed to [employer] ?-- Tarry Kos - Monday, November 03, 2021 2:02 PM --Andrew Walters enrollment faxed to [employer].  ?

## 2021-11-03 NOTE — Telephone Encounter (Signed)
11/03/2021 2:18:03 PM - [place] Pepper enrollment faxed to [employer] ?-- Tarry Kos - Monday, November 03, 2021 2:16 PM --[place] enrollment faxed to [employer]. ?

## 2021-11-18 ENCOUNTER — Other Ambulatory Visit: Payer: Self-pay

## 2021-11-19 ENCOUNTER — Other Ambulatory Visit: Payer: Self-pay

## 2021-11-26 ENCOUNTER — Ambulatory Visit: Payer: Self-pay | Admitting: Family

## 2021-11-26 ENCOUNTER — Telehealth: Payer: Self-pay | Admitting: Family

## 2021-11-26 NOTE — Telephone Encounter (Signed)
Patient did not show for his [facility] appointment on 11/26/21. Will attempt to reschedule.   ?

## 2021-11-26 NOTE — Progress Notes (Deleted)
Patient ID: Andrew Walters, male    DOB: [birth year], 41 y.o.   MRN: [id number]  HPI  Andrew Walters is a 41 y/o male with a history of HTN, current tobacco use and chronic heart failure.   Echo report from 04/29/21 reviewed and showed an EF of 25-30% along with mild LVH, severe LAE and mild Andrew.   Was in the ED 08/02/21 due to chest pain after being out of medications for ~ 2 weeks. Tested + covid. Evaluated and released. Admitted 04/28/21 due to chest pain and shortness of breath. Cardiology consult obtained. Elevated troponin thought to be due to demand ischemia. Chest CTA negative for PE. HTN meds started. Discharged after 4 days.   He presents today for a follow-up visit with a chief complaint of   Past Medical History:  Diagnosis Date   CHF (congestive heart failure) (HCC)    Hypertension    No past surgical history on file. No family history on file. Social History   Tobacco Use   Smoking status: Every Day   Smokeless tobacco: Never  Substance Use Topics   Alcohol use: No   No Known Allergies    Review of Systems  Constitutional:  Negative for appetite change, fatigue and fever.  HENT:  Negative for congestion, postnasal drip and sore throat.   Eyes: Negative.   Respiratory:  Positive for cough, shortness of breath and wheezing.   Cardiovascular:  Negative for chest pain, palpitations and leg swelling.  Gastrointestinal:  Negative for abdominal distention.  Endocrine: Negative.   Genitourinary: Negative.   Musculoskeletal:  Positive for arthralgias (legs hurt at times). Negative for back pain.  Skin: Negative.   Allergic/Immunologic: Negative.   Neurological:  Positive for dizziness. Negative for light-headedness.  Hematological:  Negative for adenopathy. Does not bruise/bleed easily.  Psychiatric/Behavioral:  Positive for dysphoric mood. Negative for sleep disturbance (sleeping on 1 pillow). The patient is not nervous/anxious.       Physical Exam Vitals and nursing note  reviewed.  Constitutional:      Appearance: Normal appearance.  HENT:     Head: Normocephalic and atraumatic.  Cardiovascular:     Rate and Rhythm: Normal rate and regular rhythm.  Pulmonary:     Effort: Pulmonary effort is normal. No respiratory distress.     Breath sounds: No wheezing, rhonchi or rales.  Abdominal:     General: There is no distension.     Palpations: Abdomen is soft.  Musculoskeletal:        General: No tenderness.     Cervical back: Normal range of motion and neck supple.     Right lower leg: No edema.     Left lower leg: No edema.  Skin:    General: Skin is warm and dry.  Neurological:     General: No focal deficit present.     Mental Status: He is alert and oriented to person, place, and time.  Psychiatric:        Mood and Affect: Mood is depressed. Mood is not anxious.        Behavior: Behavior normal.        Thought Content: Thought content normal.    Assessment & Plan:  1: Chronic heart failure with reduced ejection fraction- - NYHA class II - euvolemic today - weighing daily; reminded to call for an overnight weight gain of > 2 pounds or a weekly weight gain of >5 pounds - weight 227.4 pounds from last visit here 1 month ago -  not adding salt - has seen cardiology [facility] Flakes) in the past - on GDMT of metoprolol, entresto, jardiance and spironolactone - BNP 04/28/21 was 396.2  2: HTN- - BP  - BMP 08/02/21 reviewed and showed sodium 138, potassium 3.9, creatinine 1.43 and GFR >60 - does not have any medical insurance so [facility] information provided, again; will also message provider at [facility] to see if they can reach out to him  3: Tobacco use- - smokes ~ 1/3 pack of cigarettes daily  4: Depression/ stress- - endorses a lot of stressors that he knows are affecting his heart - currently going through a divorce and now has both his kids living with him - [facility]   Medication bottles reviewed.

## 2021-11-28 ENCOUNTER — Other Ambulatory Visit: Payer: Self-pay

## 2022-01-12 ENCOUNTER — Other Ambulatory Visit: Payer: Self-pay

## 2022-01-20 ENCOUNTER — Other Ambulatory Visit: Payer: Self-pay

## 2022-01-22 ENCOUNTER — Other Ambulatory Visit: Payer: Self-pay

## 2022-01-22 ENCOUNTER — Encounter: Payer: Self-pay | Admitting: Family

## 2022-01-22 ENCOUNTER — Ambulatory Visit: Payer: Self-pay | Attending: Family | Admitting: Family

## 2022-01-22 VITALS — BP 163/88 | HR 76 | Resp 20 | Ht 71.0 in | Wt 236.1 lb

## 2022-01-22 DIAGNOSIS — I5022 Chronic systolic (congestive) heart failure: Secondary | ICD-10-CM | POA: Insufficient documentation

## 2022-01-22 DIAGNOSIS — F329 Major depressive disorder, single episode, unspecified: Secondary | ICD-10-CM

## 2022-01-22 DIAGNOSIS — Z635 Disruption of family by separation and divorce: Secondary | ICD-10-CM | POA: Insufficient documentation

## 2022-01-22 DIAGNOSIS — I10 Essential (primary) hypertension: Secondary | ICD-10-CM

## 2022-01-22 DIAGNOSIS — F32A Depression, unspecified: Secondary | ICD-10-CM | POA: Insufficient documentation

## 2022-01-22 DIAGNOSIS — F17210 Nicotine dependence, cigarettes, uncomplicated: Secondary | ICD-10-CM | POA: Insufficient documentation

## 2022-01-22 DIAGNOSIS — Z720 Tobacco use: Secondary | ICD-10-CM

## 2022-01-22 DIAGNOSIS — I110 Hypertensive heart disease with heart failure: Secondary | ICD-10-CM | POA: Insufficient documentation

## 2022-01-22 DIAGNOSIS — Z8616 Personal history of COVID-19: Secondary | ICD-10-CM | POA: Insufficient documentation

## 2022-01-22 MED ORDER — SPIRONOLACTONE 25 MG PO TABS
ORAL_TABLET | Freq: Every day | ORAL | 3 refills | Status: DC
  Filled 2022-01-22: qty 29, 29d supply, fill #0
  Filled 2022-08-03: qty 29, 29d supply, fill #1

## 2022-01-22 NOTE — Progress Notes (Signed)
Patient ID: Andrew Walters, male    DOB: 29-Jan-1981, 41 y.o.   MRN: [id number]  HPI  Andrew Walters is a 41 y/o male with a history of HTN, current tobacco use and chronic heart failure.   Echo report from 04/29/21 reviewed and showed an EF of 25-30% along with mild LVH, severe LAE and mild Andrew.   Was in the ED 08/02/21 due to chest pain after being out of medications for ~ 2 weeks. Tested + covid. Evaluated and released.   He presents today for a follow-up visit with a chief complaint of minimal shortness of breath upon moderate exertion. Describes this as chronic in nature. Has associated fatigue, abdominal distention, dizziness, depression and gradual weight gain along with this. Denies any difficulty sleeping, palpitations, pedal edema, chest pain, wheezing or cough.   Has been out of spironolactone for ~ 1 week as he needs a refill.   Drinking 16 ounces of water and 8 ounces of cranberry juice daily. Denies any other fluid intake.   Past Medical History:  Diagnosis Date   CHF (congestive heart failure) ([place])    Hypertension    No past surgical history on file. No family history on file. Social History   Tobacco Use   Smoking status: Every Day   Smokeless tobacco: Never  Substance Use Topics   Alcohol use: No   No Known Allergies  Prior to Admission medications   Medication Sig Start Date End Date Taking? Authorizing Provider  empagliflozin (JARDIANCE) 10 MG TABS tablet Take 1 tablet (10 mg total) by mouth once daily before breakfast. 09/18/21  Yes Darylene Price A, FNP  metoprolol succinate (TOPROL-XL) 50 MG 24 hr tablet Take 1 tablet (50 mg total) by mouth once daily. Take with or immediately following a meal. 10/27/21  Yes ,  A, FNP  sacubitril-valsartan (ENTRESTO) 49-51 MG Take 1 tablet by mouth 2 (two) times daily. 10/13/21 02/17/22 Yes Alisa Graff, FNP  spironolactone (ALDACTONE) 25 MG tablet TAKE ONE TABLET BY MOUTH ONCE DAILY(Not taking) 01/22/22   Alisa Graff, FNP     Review of Systems  Constitutional:  Positive for fatigue. Negative for appetite change.  HENT:  Negative for congestion, postnasal drip and sore throat.   Eyes: Negative.   Respiratory:  Positive for shortness of breath ("little bit"). Negative for cough and wheezing.   Cardiovascular:  Negative for chest pain, palpitations and leg swelling.  Gastrointestinal:  Positive for abdominal distention. Negative for abdominal pain.  Endocrine: Negative.   Genitourinary: Negative.   Musculoskeletal:  Positive for arthralgias (legs hurt at times). Negative for back pain.  Skin: Negative.   Allergic/Immunologic: Negative.   Neurological:  Positive for dizziness (with sudden position changes). Negative for light-headedness.  Hematological:  Negative for adenopathy. Does not bruise/bleed easily.  Psychiatric/Behavioral:  Positive for dysphoric mood. Negative for sleep disturbance (sleeping on 3 pillows (due to comfort)). The patient is not nervous/anxious.    Vitals:   01/22/22 1436  BP: (!) 163/88  Pulse: 76  Resp: 20  SpO2: 96%  Weight: 236 lb 2 oz (107.1 kg)  Height: 5' 11" (1.803 m)   Wt Readings from Last 3 Encounters:  01/22/22 236 lb 2 oz (107.1 kg)  10/27/21 227 lb 4 oz (103.1 kg)  09/18/21 223 lb 1 oz (101.2 kg)   Lab Results  Component Value Date   CREATININE 1.34 (H) 10/27/2021   CREATININE 1.43 (H) 08/02/2021   CREATININE 1.28 (H) 05/13/2021    Physical  Patient ID: Andrew Walters, male    DOB: 29-Jan-1981, 41 y.o.   MRN: [id number]  HPI  Andrew Walters is a 41 y/o male with a history of HTN, current tobacco use and chronic heart failure.   Echo report from 04/29/21 reviewed and showed an EF of 25-30% along with mild LVH, severe LAE and mild Andrew.   Was in the ED 08/02/21 due to chest pain after being out of medications for ~ 2 weeks. Tested + covid. Evaluated and released.   He presents today for a follow-up visit with a chief complaint of minimal shortness of breath upon moderate exertion. Describes this as chronic in nature. Has associated fatigue, abdominal distention, dizziness, depression and gradual weight gain along with this. Denies any difficulty sleeping, palpitations, pedal edema, chest pain, wheezing or cough.   Has been out of spironolactone for ~ 1 week as he needs a refill.   Drinking 16 ounces of water and 8 ounces of cranberry juice daily. Denies any other fluid intake.   Past Medical History:  Diagnosis Date   CHF (congestive heart failure) ([place])    Hypertension    No past surgical history on file. No family history on file. Social History   Tobacco Use   Smoking status: Every Day   Smokeless tobacco: Never  Substance Use Topics   Alcohol use: No   No Known Allergies  Prior to Admission medications   Medication Sig Start Date End Date Taking? Authorizing Provider  empagliflozin (JARDIANCE) 10 MG TABS tablet Take 1 tablet (10 mg total) by mouth once daily before breakfast. 09/18/21  Yes Darylene Price A, FNP  metoprolol succinate (TOPROL-XL) 50 MG 24 hr tablet Take 1 tablet (50 mg total) by mouth once daily. Take with or immediately following a meal. 10/27/21  Yes ,  A, FNP  sacubitril-valsartan (ENTRESTO) 49-51 MG Take 1 tablet by mouth 2 (two) times daily. 10/13/21 02/17/22 Yes Alisa Graff, FNP  spironolactone (ALDACTONE) 25 MG tablet TAKE ONE TABLET BY MOUTH ONCE DAILY(Not taking) 01/22/22   Alisa Graff, FNP     Review of Systems  Constitutional:  Positive for fatigue. Negative for appetite change.  HENT:  Negative for congestion, postnasal drip and sore throat.   Eyes: Negative.   Respiratory:  Positive for shortness of breath ("little bit"). Negative for cough and wheezing.   Cardiovascular:  Negative for chest pain, palpitations and leg swelling.  Gastrointestinal:  Positive for abdominal distention. Negative for abdominal pain.  Endocrine: Negative.   Genitourinary: Negative.   Musculoskeletal:  Positive for arthralgias (legs hurt at times). Negative for back pain.  Skin: Negative.   Allergic/Immunologic: Negative.   Neurological:  Positive for dizziness (with sudden position changes). Negative for light-headedness.  Hematological:  Negative for adenopathy. Does not bruise/bleed easily.  Psychiatric/Behavioral:  Positive for dysphoric mood. Negative for sleep disturbance (sleeping on 3 pillows (due to comfort)). The patient is not nervous/anxious.    Vitals:   01/22/22 1436  BP: (!) 163/88  Pulse: 76  Resp: 20  SpO2: 96%  Weight: 236 lb 2 oz (107.1 kg)  Height: 5' 11" (1.803 m)   Wt Readings from Last 3 Encounters:  01/22/22 236 lb 2 oz (107.1 kg)  10/27/21 227 lb 4 oz (103.1 kg)  09/18/21 223 lb 1 oz (101.2 kg)   Lab Results  Component Value Date   CREATININE 1.34 (H) 10/27/2021   CREATININE 1.43 (H) 08/02/2021   CREATININE 1.28 (H) 05/13/2021    Physical

## 2022-01-22 NOTE — Patient Instructions (Addendum)
Continue weighing daily and call for an overnight weight gain of 3 pounds or more or a weekly weight gain of more than 5 pounds.   If you have voicemail, please make sure your mailbox is cleaned out so that we may leave a message and please make sure to listen to any voicemails.    [facility]   [phone]  [address] [place] [address]    Resume taking spironolactone as 1 tablet every day.    Make sure you are drinking about 60 ounces of liquid daily

## 2022-01-22 NOTE — Progress Notes (Signed)
Heart and Vascular Care Navigation  01/22/2022  Andrew Walters 1981/06/03 [id number]  Reason for Referral: Patient seen in HF clinic due to lack of insurance and PCP.   Engaged with patient face to face for initial visit for Heart and Vascular Care Coordination.                                                                                                   Assessment:  Patient is 41 yo male who states he resides with his girlfriend and son. Patient states he has not worked in a few years and his last job was at [employer]. Patient states he has no income and no insurance. Patient reports he does received food stamps. He is able to use his girlfriends car for transportation to appointments.                              HRT/VAS Care Coordination     Patients Home Cardiology Office [facility]   Outpatient Care Team Social Worker   Social Worker Name: Lasandra Beech, [place] [phone]   Living arrangements for the past 2 months Single Family Home   Lives with: Significant Other  Girlfriend and son   Patient Current Insurance Coverage Self-Pay   Patient Has Concern With Paying Medical Bills Yes   Does Patient Have Prescription Coverage? No   Home Assistive Devices/Equipment None       Social History:                                                                             SDOH Screenings   Alcohol Screen: Not on file  Depression (PHQ2-9): Low Risk  (09/18/2021)   Depression (PHQ2-9)    PHQ-2 Score: 0  Financial Resource Strain: High Risk (01/22/2022)   Overall Financial Resource Strain (CARDIA)    Difficulty of Paying Living Expenses: Very hard  Food Insecurity: Food Insecurity Present (01/22/2022)   Hunger Vital Sign    Worried About Running Out of Food in the Last Year: Sometimes true    Ran Out of Food in the Last Year: Sometimes true  Housing: Medium Risk (01/22/2022)   Housing    Last Housing Risk Score: 1  Physical Activity: Not on file  Social Connections: Not on file   Stress: Not on file  Tobacco Use: High Risk (01/22/2022)   Patient History    Smoking Tobacco Use: Every Day    Smokeless Tobacco Use: Never    Passive Exposure: Not on file  Transportation Needs: No Transportation Needs (01/22/2022)   PRAPARE - Transportation    Lack of Transportation (Medical): No    Lack of Transportation (Non-Medical): No    SDOH Interventions: Financial Resources:  Corporate treasurer Interventions: Artist DSS for financial  assistance and Tree surgeon for Medical illustrator Insecurity:  Food Insecurity Interventions:  (Has Sales executive)  Housing Insecurity:  Housing Interventions: Intervention Not Indicated  Transportation:   Transportation Interventions: Intervention Not Indicated   Follow-up plan:  CSW discussed Social Security Disability and Medicaid and provided information to apply for both. Patient aware of the [facility] and will call to obtain PCP appointment. Patient verbalizes understanding of follow up and will reach out with any questions. Lasandra Beech, LCSW, CCSW-MCS [phone]

## 2022-01-23 ENCOUNTER — Encounter: Payer: Self-pay | Admitting: Family

## 2022-01-27 NOTE — Progress Notes (Deleted)
Patient ID: Andrew Walters, male    DOB: 16-Aug-1980, 41 y.o.   MRN: [id number]  HPI  Andrew Walters is a 41 y/o male with a history of HTN, current tobacco use and chronic heart failure.   Echo report from 04/29/21 reviewed and showed an EF of 25-30% along with mild LVH, severe LAE and mild Andrew.   Was in the ED 08/02/21 due to chest pain after being out of medications for ~ 2 weeks. Tested + covid. Evaluated and released.   He presents today for a follow-up visit with a chief complaint of   Past Medical History:  Diagnosis Date   CHF (congestive heart failure) (HCC)    Hypertension    No past surgical history on file. No family history on file. Social History   Tobacco Use   Smoking status: Every Day   Smokeless tobacco: Never  Substance Use Topics   Alcohol use: No   No Known Allergies    Review of Systems  Constitutional:  Positive for fatigue. Negative for appetite change.  HENT:  Negative for congestion, postnasal drip and sore throat.   Eyes: Negative.   Respiratory:  Positive for shortness of breath ("little bit"). Negative for cough and wheezing.   Cardiovascular:  Negative for chest pain, palpitations and leg swelling.  Gastrointestinal:  Positive for abdominal distention. Negative for abdominal pain.  Endocrine: Negative.   Genitourinary: Negative.   Musculoskeletal:  Positive for arthralgias (legs hurt at times). Negative for back pain.  Skin: Negative.   Allergic/Immunologic: Negative.   Neurological:  Positive for dizziness (with sudden position changes). Negative for light-headedness.  Hematological:  Negative for adenopathy. Does not bruise/bleed easily.  Psychiatric/Behavioral:  Positive for dysphoric mood. Negative for sleep disturbance (sleeping on 3 pillows (due to comfort)). The patient is not nervous/anxious.       Physical Exam Vitals and nursing note reviewed.  Constitutional:      Appearance: Normal appearance.  HENT:     Head: Normocephalic and  atraumatic.  Cardiovascular:     Rate and Rhythm: Normal rate and regular rhythm.  Pulmonary:     Effort: Pulmonary effort is normal. No respiratory distress.     Breath sounds: No wheezing, rhonchi or rales.  Abdominal:     General: There is no distension.     Palpations: Abdomen is soft.  Musculoskeletal:        General: No tenderness.     Cervical back: Normal range of motion and neck supple.     Right lower leg: No edema.     Left lower leg: No edema.  Skin:    General: Skin is warm and dry.  Neurological:     General: No focal deficit present.     Mental Status: He is alert and oriented to person, place, and time.  Psychiatric:        Mood and Affect: Mood is depressed. Mood is not anxious.        Behavior: Behavior normal.        Thought Content: Thought content normal.    Assessment & Plan:  1: Chronic heart failure with reduced ejection fraction- - NYHA class II - euvolemic today - weighing daily; reminded to call for an overnight weight gain of > 2 pounds or a weekly weight gain of >5 pounds - weight 236.2 pounds from last visit here 1 week ago - not adding salt - not drinking much fluids; explained that he needed to drink ~ 60 ounces of  fluid daily - has seen cardiology [facility] Flakes) in the past - on GDMT of metoprolol, entresto, jardiance and spironolactone - check BMP today since spironolactone refilled at last visit - BNP 04/28/21 was 396.2  2: HTN- - BP - BMP 10/27/21 reviewed and showed sodium 135, potassium 4.1, creatinine 1.34 and GFR >60 - again, emphasized that he get established with [facility]; contact information placed on his AVS  3: Tobacco use- - smokes ~ 1/3 pack of cigarettes daily - denies alcohol/drug use  4: Depression/ stress- - endorses a lot of stressors that he knows are affecting his heart - currently going through a divorce and now has both his kids living with him - [facility]    Medication  bottles reviewed.

## 2022-01-28 ENCOUNTER — Ambulatory Visit: Payer: Self-pay | Admitting: Family

## 2022-01-28 ENCOUNTER — Telehealth: Payer: Self-pay | Admitting: Family

## 2022-01-28 NOTE — Telephone Encounter (Signed)
Patient did not show for his Heart Failure Clinic appointment on 01/28/22. Will attempt to reschedule.

## 2022-01-29 ENCOUNTER — Other Ambulatory Visit: Payer: Self-pay

## 2022-02-18 ENCOUNTER — Ambulatory Visit: Payer: Self-pay | Admitting: Family

## 2022-02-18 ENCOUNTER — Telehealth: Payer: Self-pay | Admitting: Family

## 2022-02-18 NOTE — Telephone Encounter (Signed)
Patient did not show for his [facility] Failure Clinic appointment on 02/18/22. Will attempt to reschedule.

## 2022-03-31 DIAGNOSIS — I509 Heart failure, unspecified: Secondary | ICD-10-CM | POA: Diagnosis not present

## 2022-03-31 DIAGNOSIS — R69 Illness, unspecified: Secondary | ICD-10-CM | POA: Diagnosis not present

## 2022-03-31 DIAGNOSIS — Z6832 Body mass index (BMI) 32.0-32.9, adult: Secondary | ICD-10-CM | POA: Diagnosis not present

## 2022-03-31 DIAGNOSIS — Z720 Tobacco use: Secondary | ICD-10-CM | POA: Diagnosis not present

## 2022-03-31 DIAGNOSIS — Z131 Encounter for screening for diabetes mellitus: Secondary | ICD-10-CM | POA: Diagnosis not present

## 2022-03-31 DIAGNOSIS — Z1159 Encounter for screening for other viral diseases: Secondary | ICD-10-CM | POA: Diagnosis not present

## 2022-03-31 DIAGNOSIS — I10 Essential (primary) hypertension: Secondary | ICD-10-CM | POA: Diagnosis not present

## 2022-04-16 ENCOUNTER — Other Ambulatory Visit: Payer: Self-pay

## 2022-05-05 ENCOUNTER — Other Ambulatory Visit: Payer: Self-pay

## 2022-07-10 ENCOUNTER — Other Ambulatory Visit: Payer: Self-pay

## 2022-08-03 ENCOUNTER — Other Ambulatory Visit: Payer: Self-pay

## 2022-08-06 ENCOUNTER — Ambulatory Visit: Payer: No Typology Code available for payment source | Admitting: Pulmonary Disease

## 2022-08-06 ENCOUNTER — Encounter: Payer: Self-pay | Admitting: Pulmonary Disease

## 2022-08-06 VITALS — BP 164/92 | HR 84 | Ht 71.0 in | Wt 247.8 lb

## 2022-08-06 DIAGNOSIS — R0683 Snoring: Secondary | ICD-10-CM | POA: Diagnosis not present

## 2022-08-06 NOTE — Patient Instructions (Signed)
Will arrange for a home sleep study Will call to arrange for follow up after sleep study reviewed  

## 2022-08-06 NOTE — Progress Notes (Signed)
[facility] [facility]  Chief Complaint  Patient presents with   Consult    Pt sleep consult, denies hx of sleep study or CPAP use. States he has increased fatigue, morning headaches. Epworth=24    Past Surgical History:  He  has no past surgical history on file.  Past Medical History:  HTN, CHF  Constitutional:  BP (!) 164/92   Pulse 84   Ht 5\' 11"  (1.803 m)   Wt 247 lb 12.8 oz (112.4 kg)   SpO2 98%   BMI 34.56 kg/m   Brief Summary:  Andrew Walters is a 42 y.o. male smoker with snoring.      Subjective:   His girlfriend has been worried about his snoring for years.  He stops breathing at night.  He is a restless sleeper.  He feels sleepy during the day.  He falls asleep whenever he sits for a while.  He has trouble sleeping on his back.  His heart doctor told him sleep apnea could impact his heart function.  He goes to sleep at 8 pm.  He falls asleep in 5 minutes.  He wakes up a few times to use the bathroom.  He gets out of bed at 530 am.  He feels tired in the morning.  He gets headaches during the day.  He does not use anything to help him fall sleep.  He drinks coffee in the day, but this doesn't help him stay awake.  He denies sleep walking, sleep talking, bruxism, or nightmares.  There is no history of restless legs.  He denies sleep hallucinations, sleep paralysis, or cataplexy.  The Epworth score is 24 out of 24.   Physical Exam:   Appearance - well kempt   ENMT - no sinus tenderness, no oral exudate, no LAN, Mallampati 4 airway, no stridor  Respiratory - equal breath sounds bilaterally, no wheezing or rales  CV - s1s2 regular rate and rhythm, no murmurs  Ext - no clubbing, no edema  Skin - no rashes  Psych - normal mood and affect   Sleep Tests:    Cardiac Tests:  Echo 04/29/21 >> EF 25 to 30%, mild LVH, grade 2 DD, severe RV dysfx, severe LA/RA dilation, mild MR Echo 05/18/22 >> EF 55%, mild AR  Social History:   He  reports that he has been smoking. He has never used smokeless tobacco. He reports current drug use. Drug: Marijuana. He reports that he does not drink alcohol.  Family History:  His family history includes Sleep apnea in his brother.    Discussion:  He has snoring, sleep disruption, apnea, and daytime sleepiness.  He has a history of congestive heart failure and hypertension.  He has a family history of sleep apnea.  I am concerned he has significant obstructive sleep apnea.    Assessment/Plan:   Snoring with excessive daytime sleepiness. - will need to arrange for a home sleep study  Non-ischemic cardiomyopathy with chronic combined CHF. - followed by Dr. Jacques Earthly with cardiology at [facility]  Obesity. - discussed how weight can impact sleep and risk for sleep disordered breathing - discussed options to assist with weight loss: combination of diet modification, cardiovascular and strength training exercises  Cardiovascular risk. - had an extensive discussion regarding the adverse health consequences related to untreated sleep disordered breathing - specifically discussed the risks for hypertension, coronary artery disease, cardiac dysrhythmias, cerebrovascular disease, and diabetes - lifestyle modification discussed  Safe driving practices. -  discussed how sleep disruption can increase risk of accidents, particularly when driving - safe driving practices were discussed  Therapies for obstructive sleep apnea. - if the sleep study shows significant sleep apnea, then various therapies for treatment were reviewed: CPAP, oral appliance, and surgical interventions   Time Spent Involved in Patient Care on Day of Examination:  35 minutes  Follow up:   Patient Instructions  Will arrange for a home sleep study Will call to arrange for follow up after sleep study reviewed   Medication List:   Allergies as of 08/06/2022   No Known Allergies      Medication List         Accurate as of August 06, 2022 11:54 AM. If you have any questions, ask your nurse or doctor.          empagliflozin 10 MG Tabs tablet Commonly known as: Jardiance Take 1 tablet (10 mg total) by mouth once daily before breakfast.   Entresto 49-51 MG Generic drug: sacubitril-valsartan Take 1 tablet by mouth 2 (two) times daily.   metoprolol succinate 50 MG 24 hr tablet Commonly known as: TOPROL-XL Take 1 tablet (50 mg total) by mouth once daily. Take with or immediately following a meal.   spironolactone 25 MG tablet Commonly known as: ALDACTONE TAKE ONE TABLET BY MOUTH ONCE DAILY   valsartan 80 MG tablet Commonly known as: DIOVAN Take 80 mg by mouth daily.        Signature:  Chesley Mires, MD [place] Pager - [phone] 08/06/2022, 11:54 AM

## 2022-08-21 ENCOUNTER — Other Ambulatory Visit: Payer: Self-pay

## 2022-09-11 ENCOUNTER — Other Ambulatory Visit: Payer: Self-pay

## 2022-09-14 ENCOUNTER — Other Ambulatory Visit: Payer: Self-pay

## 2022-10-13 ENCOUNTER — Other Ambulatory Visit: Payer: Self-pay

## 2022-10-16 ENCOUNTER — Telehealth: Payer: Self-pay | Admitting: Pharmacist

## 2022-10-16 NOTE — Telephone Encounter (Signed)
Received rx refill request fr Entresto from [facility]. Patient was "no show" to last 3 office visits.   Provider is unable to refill his medication until patient contact clinic.  Will attempt to schedule next OV

## 2022-10-26 ENCOUNTER — Ambulatory Visit (HOSPITAL_BASED_OUTPATIENT_CLINIC_OR_DEPARTMENT_OTHER): Payer: 59 | Admitting: Family

## 2022-10-26 ENCOUNTER — Other Ambulatory Visit: Payer: Self-pay | Admitting: Family

## 2022-10-26 ENCOUNTER — Encounter: Payer: Self-pay | Admitting: Family

## 2022-10-26 ENCOUNTER — Ambulatory Visit
Admission: RE | Admit: 2022-10-26 | Discharge: 2022-10-26 | Disposition: A | Discharge status: Home / Self Care | Payer: 59 | Source: Ambulatory Visit | Attending: Family | Admitting: Family

## 2022-10-26 VITALS — BP 160/100 | HR 93 | Resp 14 | Wt 255.0 lb

## 2022-10-26 DIAGNOSIS — F17210 Nicotine dependence, cigarettes, uncomplicated: Secondary | ICD-10-CM | POA: Insufficient documentation

## 2022-10-26 DIAGNOSIS — R0683 Snoring: Secondary | ICD-10-CM | POA: Insufficient documentation

## 2022-10-26 DIAGNOSIS — I5023 Acute on chronic systolic (congestive) heart failure: Secondary | ICD-10-CM | POA: Diagnosis present

## 2022-10-26 DIAGNOSIS — Z720 Tobacco use: Secondary | ICD-10-CM

## 2022-10-26 DIAGNOSIS — I110 Hypertensive heart disease with heart failure: Secondary | ICD-10-CM | POA: Insufficient documentation

## 2022-10-26 DIAGNOSIS — Z79899 Other long term (current) drug therapy: Secondary | ICD-10-CM | POA: Insufficient documentation

## 2022-10-26 DIAGNOSIS — F32A Depression, unspecified: Secondary | ICD-10-CM | POA: Diagnosis not present

## 2022-10-26 DIAGNOSIS — G479 Sleep disorder, unspecified: Secondary | ICD-10-CM | POA: Insufficient documentation

## 2022-10-26 DIAGNOSIS — I10 Essential (primary) hypertension: Secondary | ICD-10-CM | POA: Diagnosis not present

## 2022-10-26 DIAGNOSIS — I5033 Acute on chronic diastolic (congestive) heart failure: Secondary | ICD-10-CM | POA: Insufficient documentation

## 2022-10-26 DIAGNOSIS — F329 Major depressive disorder, single episode, unspecified: Secondary | ICD-10-CM

## 2022-10-26 LAB — BASIC METABOLIC PANEL
Anion gap: 7 (ref 5–15)
BUN: 17 mg/dL (ref 6–20)
CO2: 29 mmol/L (ref 22–32)
Calcium: 9.2 mg/dL (ref 8.9–10.3)
Chloride: 102 mmol/L (ref 98–111)
Creatinine, Ser: 1.37 mg/dL — ABNORMAL HIGH (ref 0.61–1.24)
GFR, Estimated: >60 mL/min (ref >60)
Glucose, Bld: 111 mg/dL — ABNORMAL HIGH (ref 70–99)
Potassium: 3.5 mmol/L (ref 3.5–5.1)
Sodium: 138 mmol/L (ref 135–145)

## 2022-10-26 LAB — BRAIN NATRIURETIC PEPTIDE: B Natriuretic Peptide: 28.7 pg/mL (ref 0.0–100.0)

## 2022-10-26 MED ORDER — FUROSEMIDE 10 MG/ML IJ SOLN
INTRAMUSCULAR | Status: AC
  Administered 2022-10-26: 80 mg via INTRAVENOUS
  Filled 2022-10-26: qty 8

## 2022-10-26 MED ORDER — POTASSIUM CHLORIDE CRYS ER 20 MEQ PO TBCR
EXTENDED_RELEASE_TABLET | ORAL | Status: AC
  Administered 2022-10-26: 40 meq via ORAL
  Filled 2022-10-26: qty 2

## 2022-10-26 MED ORDER — POTASSIUM CHLORIDE CRYS ER 20 MEQ PO TBCR: 40.0000 meq | EXTENDED_RELEASE_TABLET | Freq: Once | ORAL | Status: AC

## 2022-10-26 MED ORDER — FUROSEMIDE 10 MG/ML IJ SOLN: 80.0000 mg | Freq: Once | INTRAMUSCULAR | Status: AC

## 2022-10-26 NOTE — Progress Notes (Signed)
REDS VEST READING= 50 CHEST RULER=36  VEST FITTING TASKS: POSTURE=sitting HEIGHT MARKER=D CENTER STRIP=aligned  COMMENTS:

## 2022-10-26 NOTE — Progress Notes (Signed)
Patient ID: Andrew Walters, male    DOB: 1980/12/29, 42 y.o.   MRN: [id number]  HPI  Andrew Walters is a 42 y/o male with a history of HTN, current tobacco use and chronic heart failure.   Echo 05/18/22: EF 55% along with mild AR. Echo 04/29/21: EF of 25-30% along with mild LVH, severe LAE and mild Andrew.   Has not been admitted or been in the ED in the last 6 months.   He presents today for a HF follow-up visit with a chief complaint of moderate SOB w/ little exertion. Chronic in nature but has worsened over the last several weeks. Has associated fatigue, dry cough, sporadic chest pain, dizziness, depression, anxiety/ panic attacks and difficulty sleeping along with this. Denies abdominal distention, palpitations, pedal edema or wheezing.   Vague about his medications as he has an empty bottle of metoprolol 100mg  but one time he says he's been out of this for days and then another time it could up to a month. No spironolactone bottle for unknown period of time. No jardiance with him but he says that he has this bottle at home although estimates that it's been 3 days since he took this.    Says that the mother of his 2 kids (69 y/o & 52 y/o) just found out that she is dying from her heart issues and that nothing else can be done. Just found this out a few days ago so he says that he's "still processing" this. Due to this information, he's been spending time between his home as well as her home.   Past Medical History:  Diagnosis Date   CHF (congestive heart failure) (HCC)    Hypertension    No past surgical history on file. Family History  Problem Relation Age of Onset   Sleep apnea Brother    Social History   Tobacco Use   Smoking status: Every Day   Smokeless tobacco: Never  Substance Use Topics   Alcohol use: No   No Known Allergies  Prior to Admission medications   Medication Sig Start Date End Date Taking? Authorizing Provider  atorvastatin (LIPITOR) 20 MG tablet Take 20 mg by mouth  daily.   Yes [provider]  empagliflozin (JARDIANCE) 10 MG TABS tablet Take 1 tablet (10 mg total) by mouth once daily before breakfast. 09/18/21  Yes Jareb Radoncic A, FNP  sacubitril-valsartan (ENTRESTO) 49-51 MG Take 1 tablet by mouth 2 (two) times daily.   Yes [provider]  metoprolol succinate (TOPROL-XL) 50 MG 24 hr tablet Take 1 tablet (50 mg total) by mouth once daily. Take with or immediately following a meal. Patient not taking: Reported on 10/26/2022 10/27/21   Delma Freeze, FNP   Review of Systems  Constitutional:  Positive for fatigue (easily). Negative for appetite change.  HENT:  Negative for congestion, postnasal drip and sore throat.   Eyes: Negative.   Respiratory:  Positive for cough (dry) and shortness of breath (easily). Negative for wheezing.   Cardiovascular:  Positive for chest pain (at times). Negative for palpitations and leg swelling.  Gastrointestinal:  Negative for abdominal distention and abdominal pain.  Endocrine: Negative.   Genitourinary: Negative.   Musculoskeletal:  Positive for arthralgias (legs hurt at times). Negative for back pain.  Skin: Negative.   Allergic/Immunologic: Negative.   Neurological:  Positive for dizziness (with sudden position changes). Negative for light-headedness.  Hematological:  Negative for adenopathy. Does not bruise/bleed easily.  Psychiatric/Behavioral:  Positive for dysphoric  mood and sleep disturbance ("off and on"). The patient is nervous/anxious (panic attacks).    Vitals:   10/26/22 1347  BP: (!) 160/100  Pulse: 93  Resp: 14  SpO2: 98%  Weight: 255 lb (115.7 kg)   Wt Readings from Last 3 Encounters:  10/26/22 255 lb (115.7 kg)  08/06/22 247 lb 12.8 oz (112.4 kg)  01/22/22 236 lb 2 oz (107.1 kg)   Lab Results  Component Value Date   CREATININE 1.34 (H) 10/27/2021   CREATININE 1.43 (H) 08/02/2021   CREATININE 1.28 (H) 05/13/2021   Physical Exam Vitals and nursing note reviewed.   Constitutional:      Appearance: Normal appearance.  HENT:     Head: Normocephalic and atraumatic.  Eyes:     Comments: Puffiness under lower eyelids bilaterally  Cardiovascular:     Rate and Rhythm: Normal rate and regular rhythm.  Pulmonary:     Effort: Pulmonary effort is normal. No respiratory distress.     Breath sounds: No wheezing, rhonchi or rales.  Abdominal:     General: There is distension.     Palpations: Abdomen is soft.     Tenderness: There is no abdominal tenderness.  Musculoskeletal:        General: No tenderness.     Cervical back: Normal range of motion and neck supple.     Right lower leg: No edema.     Left lower leg: No edema.  Skin:    General: Skin is warm and dry.  Neurological:     General: No focal deficit present.     Mental Status: He is alert and oriented to person, place, and time.  Psychiatric:        Mood and Affect: Mood is depressed. Mood is not anxious.        Behavior: Behavior normal.        Thought Content: Thought content normal.   Assessment & Plan:  1: Acute on Chronic heart failure with preserved ejection fraction- - NYHA class III - fluid overloaded with weight gain, worsening symptoms and elevated ReDs reading - not weighing daily; encouraged to resume and to call for an overnight weight gain of > 2 pounds or a weekly weight gain of >5 pounds - weight up 18.8 pounds from last visit here 9 months ago - Echo 05/18/22: EF 55% along with mild AR. Echo 04/29/21: EF of 25-30% along with mild LVH, severe LAE and mild Andrew.  - ReDs clip reading elevated at 50% - will send for 80mg  IV lasix/ PO potassium today - BNP/BMP today - not adding salt - unclear how much fluid he drinks and he can't quantify it; "stays thirsty" - saw HF cardiology Jaymes Graff) 06/04/22 - continue entresto 49/51mg  BID  - continue jardiance 10mg  daily  - at f/u visit tomorrow, send in RX for metoprolol succinate 100mg  daily as well as possibly start furosemide  40mg  daily - will also send in entresto/ jardiance tomorrow to make sure he has everything - will do EKG tomorrow - BNP 04/28/21 was 396.2  2: HTN- - BP 160/100; giving IV lasix per above - to see PCP 11/17/22 and he says that he's been seeing his PCP but he can't tell me the last time he saw her  - BMP 10/27/21 reviewed and showed sodium 135, potassium 4.1, creatinine 1.34 and GFR >60  3: Tobacco use- - smokes ~ 1/3 pack of cigarettes daily - denies alcohol/drug use  4: Depression/ stress- - endorses a  lot of stress as he recently found out that the mother of his 2 young kids is dying from her heart disease - feels like he is having panic attacks due to this; encouraged him to reach out to his PCP to see if any medication assistance would be appropriate - emotional support given  5: Snoring- - saw pulmonology Craige Cotta) 08/06/22 & was supposed to get a home sleep study set up - with finding out that his kids mother is dying, not sure he can manage this right now  Return tomorrow for re-evaluation

## 2022-10-26 NOTE — Progress Notes (Unsigned)
Patient ID: Andrew Walters, male    DOB: [birth year], 42 y.o.   MRN: [id number]  HPI  Andrew Walters is a 42 y/o male with a history of HTN, current tobacco use and chronic heart failure.   Echo 05/18/22: EF 55% along with mild AR. Echo 04/29/21: EF of 25-30% along with mild LVH, severe LAE and mild Andrew.   Has not been admitted or been in the ED in the last 6 months.   He presents today for a HF follow-up visit with a chief complaint of moderate SOB with little exertion. Chronic in nature although feels a little better from yesterday. Has fatigue, cough, intermittent chest pain, dizziness, depression, anxiety and difficulty sleeping along with this. Denies abdominal distention, palpitations, pedal edema, wheezing or weight gain.   Received 80mg  IV lasix/ 22meq PO potassium yesterday and says that he feels "a little better".   Says that the mother of his 2 kids (36 y/o & 56 y/o) just found out that she is dying from her heart issues and that nothing else can be done. Just found this out a few days ago so he says that he's "still processing" this. Due to this information, he's been spending time between his home as well as her home.   Past Medical History:  Diagnosis Date   CHF (congestive heart failure)    Hypertension    No past surgical history on file. Family History  Problem Relation Age of Onset   Sleep apnea Brother    Social History   Tobacco Use   Smoking status: Every Day   Smokeless tobacco: Never  Substance Use Topics   Alcohol use: No   No Known Allergies  Prior to Admission medications   Medication Sig Start Date End Date Taking? Authorizing Provider  atorvastatin (LIPITOR) 20 MG tablet Take 20 mg by mouth daily.   Yes [provider]  empagliflozin (JARDIANCE) 10 MG TABS tablet Take 1 tablet (10 mg total) by mouth once daily before breakfast. 09/18/21  Yes Autumn Gunn A, FNP  sacubitril-valsartan (ENTRESTO) 49-51 MG Take 1 tablet by mouth 2 (two) times daily.   Yes  [provider]  metoprolol succinate (TOPROL-XL) 50 MG 24 hr tablet Take 1 tablet (50 mg total) by mouth once daily. Take with or immediately following a meal. Patient not taking: Reported on 10/26/2022 10/27/21   Alisa Graff, FNP    Review of Systems  Constitutional:  Positive for fatigue (easily). Negative for appetite change.  HENT:  Negative for congestion, postnasal drip and sore throat.   Eyes: Negative.   Respiratory:  Positive for cough (dry) and shortness of breath (easily). Negative for wheezing.   Cardiovascular:  Positive for chest pain (at times). Negative for palpitations and leg swelling.  Gastrointestinal:  Negative for abdominal distention and abdominal pain.  Endocrine: Negative.   Genitourinary: Negative.   Musculoskeletal:  Positive for arthralgias (legs hurt at times). Negative for back pain.  Skin: Negative.   Allergic/Immunologic: Negative.   Neurological:  Positive for dizziness (at times). Negative for light-headedness.  Hematological:  Negative for adenopathy. Does not bruise/bleed easily.  Psychiatric/Behavioral:  Positive for dysphoric mood and sleep disturbance ("off and on"). The patient is nervous/anxious (panic attacks).    Vitals:   10/27/22 1454  BP: (!) 139/90  Pulse: 90  SpO2: 98%  Weight: 252 lb 8 oz (114.5 kg)   Wt Readings from Last 3 Encounters:  10/27/22 252 lb 8 oz (114.5 kg)  10/26/22 255  lb (115.7 kg)  08/06/22 247 lb 12.8 oz (112.4 kg)   Lab Results  Component Value Date   CREATININE 1.37 (H) 10/26/2022   CREATININE 1.34 (H) 10/27/2021   CREATININE 1.43 (H) 08/02/2021   Physical Exam Vitals and nursing note reviewed.  Constitutional:      Appearance: Normal appearance.  HENT:     Head: Normocephalic and atraumatic.  Eyes:     Comments: Puffiness under lower eyelids bilaterally  Cardiovascular:     Rate and Rhythm: Normal rate and regular rhythm.  Pulmonary:     Effort: Pulmonary effort is normal. No respiratory  distress.     Breath sounds: No wheezing, rhonchi or rales.  Abdominal:     General: There is no distension.     Palpations: Abdomen is soft.     Tenderness: There is no abdominal tenderness.  Musculoskeletal:        General: No tenderness.     Cervical back: Normal range of motion and neck supple.     Right lower leg: No edema.     Left lower leg: No edema.  Skin:    General: Skin is warm and dry.  Neurological:     General: No focal deficit present.     Mental Status: He is alert and oriented to person, place, and time.  Psychiatric:        Mood and Affect: Mood is depressed. Mood is not anxious.        Behavior: Behavior normal.        Thought Content: Thought content normal.   Assessment & Plan:  1: Acute on Chronic heart failure with preserved ejection fraction- - NYHA class III - fluid overloaded although a little better from yesterday - not weighing daily; encouraged to resume and to call for an overnight weight gain of > 2 pounds or a weekly weight gain of >5 pounds - weight down 3 pounds from last visit here yesterday - Echo 05/18/22: EF 55% along with mild AR. Echo 04/29/21: EF of 25-30% along with mild LVH, severe LAE and mild Andrew.  - ReDs clip reading yesterday was 50%; today is 47% - 80mg  IV lasix/ 28meq PO potassium yesterday - begin torsemide 40mg  BID/ potassium 75meq BID - since it's late in the afternoon, he can take 40mg  torsemide/ 2meq potassium once today and begin BID dosing tomorrow - continue entresto 49/51mg  BID  - continue jardiance 10mg  daily  - BMP in 2 days - not adding salt - unclear how much fluid he drinks and he can't quantify it; "stays thirsty" - saw HF cardiology Gennette Pac) 06/04/22 - BNP 10/26/22 was 28.7  2: HTN- - BP 139/90 - to see PCP 11/17/22 and he says that he's been seeing his PCP but he can't tell me the last time he saw her  - BMP 10/26/22 reviewed and showed sodium 138, potassium 3.5, creatinine 1.37 and GFR >60  3: Tobacco use- -  smokes ~ 1/3 pack of cigarettes daily - denies alcohol/drug use  4: Depression/ stress- - endorses a lot of stress as he recently found out that the mother of his 2 young kids is dying from her heart disease - feels like he is having panic attacks due to this; encouraged him to reach out to his PCP to see if any medication assistance would be appropriate - emotional support given  5: Snoring- - saw pulmonology Halford Chessman) 08/06/22 & was supposed to get a home sleep study set up - with finding out  that his kids mother is dying, not sure he can manage this right now   Return in 2 days, sooner if needed.

## 2022-10-27 ENCOUNTER — Other Ambulatory Visit: Payer: Self-pay

## 2022-10-27 ENCOUNTER — Ambulatory Visit: Payer: 59 | Attending: Family | Admitting: Family

## 2022-10-27 ENCOUNTER — Encounter: Payer: Self-pay | Admitting: Family

## 2022-10-27 VITALS — BP 139/90 | HR 90 | Wt 252.5 lb

## 2022-10-27 DIAGNOSIS — F329 Major depressive disorder, single episode, unspecified: Secondary | ICD-10-CM | POA: Diagnosis not present

## 2022-10-27 DIAGNOSIS — F32A Depression, unspecified: Secondary | ICD-10-CM | POA: Insufficient documentation

## 2022-10-27 DIAGNOSIS — F17210 Nicotine dependence, cigarettes, uncomplicated: Secondary | ICD-10-CM | POA: Insufficient documentation

## 2022-10-27 DIAGNOSIS — R0789 Other chest pain: Secondary | ICD-10-CM | POA: Insufficient documentation

## 2022-10-27 DIAGNOSIS — I110 Hypertensive heart disease with heart failure: Secondary | ICD-10-CM | POA: Insufficient documentation

## 2022-10-27 DIAGNOSIS — Z720 Tobacco use: Secondary | ICD-10-CM

## 2022-10-27 DIAGNOSIS — I5033 Acute on chronic diastolic (congestive) heart failure: Secondary | ICD-10-CM | POA: Diagnosis not present

## 2022-10-27 DIAGNOSIS — R0683 Snoring: Secondary | ICD-10-CM | POA: Diagnosis not present

## 2022-10-27 DIAGNOSIS — F419 Anxiety disorder, unspecified: Secondary | ICD-10-CM | POA: Diagnosis not present

## 2022-10-27 DIAGNOSIS — I10 Essential (primary) hypertension: Secondary | ICD-10-CM | POA: Diagnosis not present

## 2022-10-27 MED ORDER — TORSEMIDE 20 MG PO TABS
40.0000 mg | ORAL_TABLET | Freq: Two times a day (BID) | ORAL | 5 refills | Status: DC
  Filled 2022-10-27: qty 120, 30d supply, fill #0
  Filled 2023-02-15: qty 120, 30d supply, fill #1

## 2022-10-27 MED ORDER — POTASSIUM CHLORIDE CRYS ER 20 MEQ PO TBCR
40.0000 meq | EXTENDED_RELEASE_TABLET | Freq: Two times a day (BID) | ORAL | 5 refills | Status: AC
  Filled 2022-10-27: qty 120, 30d supply, fill #0
  Filled 2023-02-15: qty 120, 30d supply, fill #1

## 2022-10-27 NOTE — Progress Notes (Signed)
REDS VEST READING= 47 CHEST RULER=36  VEST FITTING TASKS: POSTURE=sitting HEIGHT MARKER=D CENTER STRIP=aligned  COMMENTS:

## 2022-10-27 NOTE — Patient Instructions (Signed)
Start taking the torsemide (fluid pill) as 2 tablets in the morning and 2 tablets in the evening. Since it's late in the afternoon today, just take 2 tablets today and starting tomorrow you will take the 2 in the morning and another 2 in the evening.    You will do the same thing with the potassium tablets. 2 tablets in the morning and 2 tablets in the evening

## 2022-10-29 ENCOUNTER — Encounter: Payer: Self-pay | Admitting: Family

## 2022-10-29 ENCOUNTER — Other Ambulatory Visit
Admission: RE | Admit: 2022-10-29 | Discharge: 2022-10-29 | Disposition: A | Discharge status: Home / Self Care | Payer: 59 | Source: Ambulatory Visit | Attending: Family | Admitting: Family

## 2022-10-29 ENCOUNTER — Ambulatory Visit (HOSPITAL_BASED_OUTPATIENT_CLINIC_OR_DEPARTMENT_OTHER): Payer: 59 | Admitting: Family

## 2022-10-29 VITALS — BP 148/96 | HR 97 | Wt 247.4 lb

## 2022-10-29 DIAGNOSIS — I10 Essential (primary) hypertension: Secondary | ICD-10-CM

## 2022-10-29 DIAGNOSIS — Z720 Tobacco use: Secondary | ICD-10-CM

## 2022-10-29 DIAGNOSIS — I5032 Chronic diastolic (congestive) heart failure: Secondary | ICD-10-CM | POA: Diagnosis present

## 2022-10-29 DIAGNOSIS — F329 Major depressive disorder, single episode, unspecified: Secondary | ICD-10-CM | POA: Diagnosis not present

## 2022-10-29 DIAGNOSIS — R0683 Snoring: Secondary | ICD-10-CM

## 2022-10-29 LAB — BASIC METABOLIC PANEL
Anion gap: 12 (ref 5–15)
BUN: 28 mg/dL — ABNORMAL HIGH (ref 6–20)
CO2: 29 mmol/L (ref 22–32)
Calcium: 9.5 mg/dL (ref 8.9–10.3)
Chloride: 98 mmol/L (ref 98–111)
Creatinine, Ser: 1.83 mg/dL — ABNORMAL HIGH (ref 0.61–1.24)
GFR, Estimated: 47 mL/min — ABNORMAL LOW (ref >60)
Glucose, Bld: 146 mg/dL — ABNORMAL HIGH (ref 70–99)
Potassium: 3.7 mmol/L (ref 3.5–5.1)
Sodium: 139 mmol/L (ref 135–145)

## 2022-10-29 NOTE — Progress Notes (Signed)
Patient ID: Andrew Walters, male    DOB: 01-22-1981, 41 y.o.   MRN: [id number]  HPI  Andrew Walters is a 42 y/o male with a history of HTN, current tobacco use and chronic heart failure.   Echo 05/18/22: EF 55% along with mild AR. Echo 04/29/21: EF of 25-30% along with mild LVH, severe LAE and mild Andrew.   Has not been admitted or been in the ED in the last 6 months.   He presents today for a HF follow-up visit with a chief complaint of moderate SOB with minimal exertion. Chronic in nature although he does feel like his breathing is better from 2 days ago. Has associated fatigue, cough, depression, anxiety and difficulty sleeping along with this. Denies dizziness, abdominal distention, palpitations, pedal edema, chest pain, wheezing or weight gain.   Torsemide 40mg / potassium 55meq BID started at last visit.   Says that the mother of his 2 kids (30 y/o & 19 y/o) found out that she is dying from her heart issues and that nothing else can be done. Just found this out a few days ago so he says that he's "still processing" this. Due to this information, he's been spending time between his home as well as her home.   Past Medical History:  Diagnosis Date   CHF (congestive heart failure)    Hypertension    No past surgical history on file. Family History  Problem Relation Age of Onset   Sleep apnea Brother    Social History   Tobacco Use   Smoking status: Every Day   Smokeless tobacco: Never  Substance Use Topics   Alcohol use: No   No Known Allergies  Prior to Admission medications   Medication Sig Start Date End Date Taking? Authorizing Provider  atorvastatin (LIPITOR) 20 MG tablet Take 20 mg by mouth daily.   Yes [provider]  empagliflozin (JARDIANCE) 10 MG TABS tablet Take 1 tablet (10 mg total) by mouth once daily before breakfast. 09/18/21  Yes Darylene Price A, FNP  metoprolol succinate (TOPROL-XL) 50 MG 24 hr tablet Take 1 tablet (50 mg total) by mouth once daily. Take with  or immediately following a meal. 10/27/21  Yes Antoinetta Berrones, Otila Kluver A, FNP  potassium chloride SA (KLOR-CON M) 20 MEQ tablet Take 2 tablets (40 mEq total) by mouth 2 (two) times daily. 10/27/22  Yes Krisanne Lich A, FNP  sacubitril-valsartan (ENTRESTO) 49-51 MG Take 1 tablet by mouth 2 (two) times daily.   Yes [provider]  torsemide (DEMADEX) 20 MG tablet Take 2 tablets (40 mg total) by mouth 2 (two) times daily. 10/27/22  Yes Alisa Graff, FNP    Review of Systems  Constitutional:  Positive for fatigue (easily). Negative for appetite change.  HENT:  Negative for congestion, postnasal drip and sore throat.   Eyes: Negative.   Respiratory:  Positive for cough (dry) and shortness of breath (easily). Negative for wheezing.   Cardiovascular:  Negative for chest pain, palpitations and leg swelling.  Gastrointestinal:  Negative for abdominal distention and abdominal pain.  Endocrine: Negative.   Genitourinary: Negative.   Musculoskeletal:  Positive for arthralgias (legs hurt at times). Negative for back pain.  Skin: Negative.   Allergic/Immunologic: Negative.   Neurological:  Negative for dizziness and light-headedness.  Hematological:  Negative for adenopathy. Does not bruise/bleed easily.  Psychiatric/Behavioral:  Positive for dysphoric mood and sleep disturbance ("off and on"). The patient is nervous/anxious (panic attacks).    Vitals:   10/29/22  1200  BP: (!) 148/96  Pulse: 97  SpO2: 98%  Weight: 247 lb 6 oz (112.2 kg)   Wt Readings from Last 3 Encounters:  10/29/22 247 lb 6 oz (112.2 kg)  10/27/22 252 lb 8 oz (114.5 kg)  10/26/22 255 lb (115.7 kg)   Lab Results  Component Value Date   CREATININE 1.37 (H) 10/26/2022   CREATININE 1.34 (H) 10/27/2021   CREATININE 1.43 (H) 08/02/2021   Physical Exam Vitals and nursing note reviewed.  Constitutional:      Appearance: Normal appearance.  HENT:     Head: Normocephalic and atraumatic.  Eyes:     Comments: Puffiness under  lower eyelids bilaterally  Cardiovascular:     Rate and Rhythm: Normal rate and regular rhythm.  Pulmonary:     Effort: Pulmonary effort is normal. No respiratory distress.     Breath sounds: No wheezing, rhonchi or rales.  Abdominal:     General: There is no distension.     Palpations: Abdomen is soft.     Tenderness: There is no abdominal tenderness.  Musculoskeletal:        General: No tenderness.     Cervical back: Normal range of motion and neck supple.     Right lower leg: No edema.     Left lower leg: No edema.  Skin:    General: Skin is warm and dry.  Neurological:     General: No focal deficit present.     Mental Status: He is alert and oriented to person, place, and time.  Psychiatric:        Mood and Affect: Mood is depressed. Mood is not anxious.        Behavior: Behavior normal.        Thought Content: Thought content normal.   Assessment & Plan:  1: Chronic heart failure with preserved ejection fraction- - NYHA class III - euvolemic - not weighing daily; encouraged to resume and to call for an overnight weight gain of > 2 pounds or a weekly weight gain of >5 pounds - weight down 5 pounds from last visit here 2 days ago - Echo 05/18/22: EF 55% along with mild AR. Echo 04/29/21: EF of 25-30% along with mild LVH, severe LAE and mild Andrew.  - ReDs clip reading 2 days ago was 47%; today is 34% - continue torsemide 40mg  BID - continue potassium 49meq BID - continue entresto 49/51mg  BID  - continue jardiance 10mg  daily  - BMP today - consider adding spiro next visit & decreasing potassium - not adding salt - unclear how much fluid he drinks and he can't quantify it; "stays thirsty" - saw HF cardiology Gennette Pac) 06/04/22 - BNP 10/26/22 was 28.7  2: HTN- - BP 148/96 - to see PCP 11/17/22  - BMP 10/26/22 reviewed and showed sodium 138, potassium 3.5, creatinine 1.37 and GFR >60  3: Tobacco use- - denies tobacco for ~ 2 months - denies alcohol/drug use  4: Depression/  stress- - endorses a lot of stress as he recently found out that the mother of his 2 young kids is dying from her heart disease - feels like he is having panic attacks due to this; encouraged him to reach out to his PCP to see if any medication assistance would be appropriate - emotional support given  5: Snoring- - saw pulmonology Halford Chessman) 08/06/22 & was supposed to get a home sleep study set up - with finding out that his kids mother is dying, not sure  he can manage this right now  Return in 2 weeks, sooner if needed.

## 2022-10-29 NOTE — Patient Instructions (Signed)
[facility] to get your [facility] drawn 

## 2022-11-10 ENCOUNTER — Other Ambulatory Visit: Payer: Self-pay

## 2022-11-15 NOTE — Progress Notes (Deleted)
Patient ID: Andrew Walters, male    DOB: May 16, 1981, 42 y.o.   MRN: [id number]  Primary cardiologist: None PCP: Patient, [name]  HPI  Mr Blaker is a 42 y/o male with a history of HTN, current tobacco use and chronic heart failure.   Echo 05/18/22: EF 55% along with mild AR. Echo 04/29/21: EF of 25-30% along with mild LVH, severe LAE and mild MR.   Has not been admitted or been in the ED in the last 6 months.   He presents today for a HF follow-up visit with a chief complaint of   Says that the mother of his 2 kids (51 y/o & 9 y/o) found out that she is dying from her heart issues and that nothing else can be done. Just found this out a few days ago so he says that he's "still processing" this. Due to this information, he's been spending time between his home as well as her home.   Past Medical History:  Diagnosis Date   CHF (congestive heart failure)    Hypertension    No past surgical history on file. Family History  Problem Relation Age of Onset   Sleep apnea Brother    Social History   Tobacco Use   Smoking status: Every Day   Smokeless tobacco: Never  Substance Use Topics   Alcohol use: No   No Known Allergies    Review of Systems  Constitutional:  Positive for fatigue (easily). Negative for appetite change.  HENT:  Negative for congestion, postnasal drip and sore throat.   Eyes: Negative.   Respiratory:  Positive for cough (dry) and shortness of breath (easily). Negative for wheezing.   Cardiovascular:  Negative for chest pain, palpitations and leg swelling.  Gastrointestinal:  Negative for abdominal distention and abdominal pain.  Endocrine: Negative.   Genitourinary: Negative.   Musculoskeletal:  Positive for arthralgias (legs hurt at times). Negative for back pain.  Skin: Negative.   Allergic/Immunologic: Negative.   Neurological:  Negative for dizziness and light-headedness.  Hematological:  Negative for adenopathy. Does not bruise/bleed easily.   Psychiatric/Behavioral:  Positive for dysphoric mood and sleep disturbance ("off and on"). The patient is nervous/anxious (panic attacks).      Physical Exam Vitals and nursing note reviewed.  Constitutional:      Appearance: Normal appearance.  HENT:     Head: Normocephalic and atraumatic.  Eyes:     Comments: Puffiness under lower eyelids bilaterally  Cardiovascular:     Rate and Rhythm: Normal rate and regular rhythm.  Pulmonary:     Effort: Pulmonary effort is normal. No respiratory distress.     Breath sounds: No wheezing, rhonchi or rales.  Abdominal:     General: There is no distension.     Palpations: Abdomen is soft.     Tenderness: There is no abdominal tenderness.  Musculoskeletal:        General: No tenderness.     Cervical back: Normal range of motion and neck supple.     Right lower leg: No edema.     Left lower leg: No edema.  Skin:    General: Skin is warm and dry.  Neurological:     General: No focal deficit present.     Mental Status: He is alert and oriented to person, place, and time.  Psychiatric:        Mood and Affect: Mood is depressed. Mood is not anxious.        Behavior: Behavior  normal.        Thought Content: Thought content normal.   Assessment & Plan:  1: Chronic heart failure with preserved ejection fraction- - NYHA class III - euvolemic - not weighing daily; encouraged to resume and to call for an overnight weight gain of > 2 pounds or a weekly weight gain of >5 pounds - weight 247.6 pounds from last visit here 2 weeks ago - Echo 05/18/22: EF 55% along with mild AR. Echo 04/29/21: EF of 25-30% along with mild LVH, severe LAE and mild MR.  - ReDs clip reading 2 days ago was 47%; today is 34% - continue torsemide  BID - continue potassium BID - continue entresto 49/51mg  BID  - continue jardiance  daily  - consider adding spiro next visit & decreasing potassium - not adding salt - unclear how much fluid he drinks and he  can't quantify it; "stays thirsty" - saw HF cardiology Jaymes Graff) 11/23 - BNP 10/26/22 was 28.7  2: HTN- - BP  - to see PCP 11/17/22  - BMP 10/29/22 reviewed and showed sodium 139, potassium 3.7, creatinine 1.83 and GFR 47 - repeat BMP Today  3: Tobacco use- - denies tobacco for ~ 2 months - denies alcohol/drug use  4: Depression/ stress- - endorses a lot of stress as he recently found out that the mother of his 2 young kids is dying from her heart disease - feels like he is having panic attacks due to this; encouraged him to reach out to his PCP to see if any medication assistance would be appropriate - emotional support given  5: Snoring- - saw pulmonology Craige Cotta) 08/06/22 & was supposed to get a home sleep study set up - with finding out that his kids mother is dying, not sure he can manage this right now

## 2022-11-16 ENCOUNTER — Encounter: Payer: 59 | Admitting: Family

## 2022-11-16 ENCOUNTER — Telehealth: Payer: Self-pay | Admitting: Family

## 2022-11-16 NOTE — Telephone Encounter (Signed)
Patient did not show for his Heart Failure Clinic appointment on 11/16/22.

## 2022-11-30 ENCOUNTER — Other Ambulatory Visit: Payer: Self-pay

## 2022-12-28 ENCOUNTER — Encounter: Payer: Self-pay | Admitting: Family

## 2022-12-28 ENCOUNTER — Telehealth: Payer: Self-pay | Admitting: Family

## 2022-12-28 NOTE — Progress Notes (Deleted)
PCP: Primary Cardiologist:  HPI:  [name] is a 42 y/o male with a history of HTN, current tobacco use and chronic heart failure.   Echo 05/18/22: EF 55% along with mild AR. Echo 04/29/21: EF of 25-30% along with mild LVH, severe LAE and mild MR.   Has not been admitted or been in the ED in the last 6 months.   He presents today for a HF follow-up visit with a chief complaint of    Says that the mother of his 2 kids (75 y/o & 31 y/o) just found out that she is dying from her heart issues and that nothing else can be done. Due to this information, he's been spending time between his home as well as her home.      ROS: All systems negative except as listed in HPI, PMH and Problem List.  SH:  Social History   Socioeconomic History   Marital status: Single    Spouse name: Not on file   Number of children: Not on file   Years of education: Not on file   Highest education level: Not on file  Occupational History   Not on file  Tobacco Use   Smoking status: Every Day   Smokeless tobacco: Never  Substance and Sexual Activity   Alcohol use: No   Drug use: Not Currently    Types: Marijuana   Sexual activity: Not on file  Other Topics Concern   Not on file  Social History Narrative   Not on file   Social Determinants of Health   Financial Resource Strain: High Risk (01/22/2022)   Overall Financial Resource Strain (CARDIA)    Difficulty of Paying Living Expenses: Very hard  Food Insecurity: Food Insecurity Present (01/22/2022)   Hunger Vital Sign    Worried About Running Out of Food in the Last Year: Sometimes true    Ran Out of Food in the Last Year: Sometimes true  Transportation Needs: No Transportation Needs (01/22/2022)   PRAPARE - Administrator, Civil Service (Medical): No    Lack of Transportation (Non-Medical): No  Physical Activity: Not on file  Stress: Not on file  Social Connections: Not on file  Intimate Partner Violence: Not on file    FH:   Family History  Problem Relation Age of Onset   Sleep apnea Brother     Past Medical History:  Diagnosis Date   CHF (congestive heart failure) (HCC)    Hypertension     Current Outpatient Medications  Medication Sig Dispense Refill   atorvastatin (LIPITOR) 20 MG tablet Take 20 mg by mouth daily.     empagliflozin (JARDIANCE) 10 MG TABS tablet Take 1 tablet (10 mg total) by mouth once daily before breakfast. 90 tablet 3   metoprolol succinate (TOPROL-XL) 50 MG 24 hr tablet Take 1 tablet (50 mg total) by mouth once daily. Take with or immediately following a meal. 90 tablet 3   potassium chloride SA (KLOR-CON M) 20 MEQ tablet Take 2 tablets (40 mEq total) by mouth 2 (two) times daily. 120 tablet 5   sacubitril-valsartan (ENTRESTO) 49-51 MG Take 1 tablet by mouth 2 (two) times daily.     torsemide (DEMADEX) 20 MG tablet Take 2 tablets (40 mg total) by mouth 2 (two) times daily. 120 tablet 5   No current facility-administered medications for this visit.     PHYSICAL EXAM:  General:  Well appearing. No resp difficulty HEENT: normal Neck: supple. JVP flat. Carotids 2+  bilaterally; no bruits. No lymphadenopathy or thryomegaly appreciated. Cor: PMI normal. Regular rate & rhythm. No rubs, gallops or murmurs. Lungs: clear Abdomen: soft, nontender, nondistended. No hepatosplenomegaly. No bruits or masses. Good bowel sounds. Extremities: no cyanosis, clubbing, rash, edema Neuro: alert & orientedx3, cranial nerves grossly intact. Moves all 4 extremities w/o difficulty. Affect pleasant.   ECG:   ASSESSMENT & PLAN:  1: Chronic heart failure with preserved ejection fraction- - NYHA class III - euvolemic - not weighing daily; encouraged to resume and to call for an overnight weight gain of > 2 pounds or a weekly weight gain of >5 pounds - weight 247.6 pounds from last visit here 2 months ago - Echo 05/18/22: EF 55% along with mild AR.  - Echo 04/29/21: EF of 25-30% along with mild LVH,  severe LAE and mild MR.  - ReDs clip reading 2 days ago was 47%; today is 34% - continue torsemide 40mg  BID - continue potassium BID - continue entresto 49/51mg  BID  - continue jardiance 10mg  daily  - consider adding spiro next visit & decreasing potassium - not adding salt - unclear how much fluid he drinks and he can't quantify it; "stays thirsty" - saw HF cardiology Andrew Walters) 06/04/22 - BNP 10/26/22 was 28.7  2: HTN- - BP  - to see PCP 11/17/22  - BMP 10/29/22 reviewed and showed sodium 139, potassium 3.7, creatinine 1.83 and GFR 47  3: Tobacco use- - denies tobacco for ~ 2 months - denies alcohol/drug use  4: Depression/ stress- - endorses a lot of stress as he recently found out that the mother of his 2 young kids is dying from her heart disease - feels like he is having panic attacks due to this; encouraged him to reach out to his PCP to see if any medication assistance would be appropriate - emotional support given  5: Snoring- - saw pulmonology Andrew Walters) 08/06/22 & was supposed to get a home sleep study set up - with finding out that his kids mother is dying, not sure he can manage this right now

## 2022-12-28 NOTE — Telephone Encounter (Signed)
Patient did not show for his Heart Failure Clinic appointment on 12/28/22  

## 2023-02-12 ENCOUNTER — Other Ambulatory Visit: Payer: Self-pay

## 2023-02-12 ENCOUNTER — Encounter: Payer: Self-pay | Admitting: Family

## 2023-02-12 ENCOUNTER — Telehealth: Payer: Self-pay | Admitting: Family

## 2023-02-12 NOTE — Telephone Encounter (Signed)
Patient arrived 1 hour after his HF appt time. His appt was rescheduled.

## 2023-02-12 NOTE — Progress Notes (Deleted)
PCP: Primary Cardiologist:  HPI:  [name] is a 42 y/o male with a history of HTN, current tobacco use and chronic heart failure.   Echo 05/18/22: EF 55% along with mild AR. Echo 04/29/21: EF of 25-30% along with mild LVH, severe LAE and mild MR.   Has not been admitted or been in the ED in the last 6 months.   He presents today for a HF follow-up visit with a chief complaint of moderate SOB with minimal exertion. Chronic in nature although he does feel like his breathing is better from 2 days ago. Has associated fatigue, cough, depression, anxiety and difficulty sleeping along with this. Denies dizziness, abdominal distention, palpitations, pedal edema, chest pain, wheezing or weight gain.   Torsemide 40mg / potassium BID started at last visit.   Says that the mother of his 2 kids (89 y/o & 65 y/o) found out that she is dying from her heart issues and that nothing else can be done. Just found this out a few days ago so he says that he's "still processing" this. Due to this information, he's been spending time between his home as well as her home.       ROS: All systems negative except as listed in HPI, PMH and Problem List.  SH:  Social History   Socioeconomic History   Marital status: Single    Spouse name: Not on file   Number of children: Not on file   Years of education: Not on file   Highest education level: Not on file  Occupational History   Not on file  Tobacco Use   Smoking status: Every Day   Smokeless tobacco: Never  Substance and Sexual Activity   Alcohol use: No   Drug use: Not Currently    Types: Marijuana   Sexual activity: Not on file  Other Topics Concern   Not on file  Social History Narrative   Not on file   Social Determinants of Health   Financial Resource Strain: High Risk (01/22/2022)   Overall Financial Resource Strain (CARDIA)    Difficulty of Paying Living Expenses: Very hard  Food Insecurity: Food Insecurity Present (01/22/2022)    Hunger Vital Sign    Worried About Running Out of Food in the Last Year: Sometimes true    Ran Out of Food in the Last Year: Sometimes true  Transportation Needs: No Transportation Needs (01/22/2022)   PRAPARE - Administrator, Civil Service (Medical): No    Lack of Transportation (Non-Medical): No  Physical Activity: Not on file  Stress: Not on file  Social Connections: Not on file  Intimate Partner Violence: Not on file    FH:  Family History  Problem Relation Age of Onset   Sleep apnea Brother     Past Medical History:  Diagnosis Date   CHF (congestive heart failure) (HCC)    Hypertension     Current Outpatient Medications  Medication Sig Dispense Refill   atorvastatin (LIPITOR) 20 MG tablet Take 20 mg by mouth daily.     empagliflozin (JARDIANCE) 10 MG TABS tablet Take 1 tablet (10 mg total) by mouth once daily before breakfast. 90 tablet 3   metoprolol succinate (TOPROL-XL) 50 MG 24 hr tablet Take 1 tablet (50 mg total) by mouth once daily. Take with or immediately following a meal. 90 tablet 3   potassium chloride SA (KLOR-CON M) 20 MEQ tablet Take 2 tablets (40 mEq total) by mouth 2 (two) times daily. 120  tablet 5   sacubitril-valsartan (ENTRESTO) 49-51 MG Take 1 tablet by mouth 2 (two) times daily.     torsemide (DEMADEX) 20 MG tablet Take 2 tablets (40 mg total) by mouth 2 (two) times daily. 120 tablet 5   No current facility-administered medications for this visit.      PHYSICAL EXAM:  General:  Well appearing. No resp difficulty HEENT: normal Neck: supple. JVP flat. Carotids 2+ bilaterally; no bruits. No lymphadenopathy or thryomegaly appreciated. Cor: PMI normal. Regular rate & rhythm. No rubs, gallops or murmurs. Lungs: clear Abdomen: soft, nontender, nondistended. No hepatosplenomegaly. No bruits or masses. Good bowel sounds. Extremities: no cyanosis, clubbing, rash, edema Neuro: alert & orientedx3, cranial nerves grossly intact. Moves all 4  extremities w/o difficulty. Affect pleasant.   ECG:   ASSESSMENT & PLAN:  1: Chronic heart failure with preserved ejection fraction- - NYHA class III - euvolemic - not weighing daily; encouraged to resume and to call for an overnight weight gain of > 2 pounds or a weekly weight gain of >5 pounds - weight down 5 pounds from last visit here 2 days ago - Echo 05/18/22: EF 55% along with mild AR. Echo 04/29/21: EF of 25-30% along with mild LVH, severe LAE and mild MR.  - ReDs clip reading 2 days ago was 47%; today is 34% - continue torsemide 40mg  BID - continue potassium BID - continue entresto 49/51mg  BID  - continue jardiance 10mg  daily  - BMP today - consider adding spiro next visit & decreasing potassium - not adding salt - unclear how much fluid he drinks and he can't quantify it; "stays thirsty" - saw HF cardiology Jaymes Graff) 06/04/22 - BNP 10/26/22 was 28.7  2: HTN- - BP 148/96 - to see PCP 11/17/22  - BMP 10/26/22 reviewed and showed sodium 138, potassium 3.5, creatinine 1.37 and GFR >60  3: Tobacco use- - denies tobacco for ~ 2 months - denies alcohol/drug use  4: Depression/ stress- - endorses a lot of stress as he recently found out that the mother of his 2 young kids is dying from her heart disease - feels like he is having panic attacks due to this; encouraged him to reach out to his PCP to see if any medication assistance would be appropriate - emotional support given  5: Snoring- - saw pulmonology Craige Cotta) 08/06/22 & was supposed to get a home sleep study set up - with finding out that his kids mother is dying, not sure he can manage this right now  Return in 2 weeks, sooner if needed.

## 2023-02-15 ENCOUNTER — Other Ambulatory Visit: Payer: Self-pay

## 2023-02-15 ENCOUNTER — Encounter: Payer: Self-pay | Admitting: *Deleted

## 2023-02-15 ENCOUNTER — Ambulatory Visit: Payer: Self-pay | Attending: Family | Admitting: Family

## 2023-02-15 VITALS — BP 182/104 | HR 88 | Wt 249.0 lb

## 2023-02-15 DIAGNOSIS — R9431 Abnormal electrocardiogram [ECG] [EKG]: Secondary | ICD-10-CM | POA: Insufficient documentation

## 2023-02-15 DIAGNOSIS — I10 Essential (primary) hypertension: Secondary | ICD-10-CM

## 2023-02-15 DIAGNOSIS — I428 Other cardiomyopathies: Secondary | ICD-10-CM | POA: Insufficient documentation

## 2023-02-15 DIAGNOSIS — I110 Hypertensive heart disease with heart failure: Secondary | ICD-10-CM | POA: Insufficient documentation

## 2023-02-15 DIAGNOSIS — F32A Depression, unspecified: Secondary | ICD-10-CM | POA: Insufficient documentation

## 2023-02-15 DIAGNOSIS — R0683 Snoring: Secondary | ICD-10-CM | POA: Insufficient documentation

## 2023-02-15 DIAGNOSIS — M25512 Pain in left shoulder: Secondary | ICD-10-CM | POA: Insufficient documentation

## 2023-02-15 DIAGNOSIS — Z5986 Financial insecurity: Secondary | ICD-10-CM | POA: Insufficient documentation

## 2023-02-15 DIAGNOSIS — M25569 Pain in unspecified knee: Secondary | ICD-10-CM | POA: Insufficient documentation

## 2023-02-15 DIAGNOSIS — I5032 Chronic diastolic (congestive) heart failure: Secondary | ICD-10-CM

## 2023-02-15 DIAGNOSIS — Z91141 Patient's other noncompliance with medication regimen due to financial hardship: Secondary | ICD-10-CM | POA: Insufficient documentation

## 2023-02-15 DIAGNOSIS — Z720 Tobacco use: Secondary | ICD-10-CM

## 2023-02-15 DIAGNOSIS — Z87891 Personal history of nicotine dependence: Secondary | ICD-10-CM | POA: Insufficient documentation

## 2023-02-15 DIAGNOSIS — F329 Major depressive disorder, single episode, unspecified: Secondary | ICD-10-CM

## 2023-02-15 MED ORDER — JARDIANCE 10 MG PO TABS
10.0000 mg | ORAL_TABLET | Freq: Every day | ORAL | 3 refills | Status: AC
  Filled 2023-02-15 (×2): qty 30, 30d supply, fill #0
  Filled 2023-02-15: qty 90, 90d supply, fill #0
  Filled 2023-03-03 – 2023-03-05 (×2): qty 30, 30d supply, fill #0

## 2023-02-15 MED ORDER — SPIRONOLACTONE 25 MG PO TABS
25.0000 mg | ORAL_TABLET | Freq: Every day | ORAL | 3 refills | Status: AC
  Filled 2023-02-15: qty 30, 30d supply, fill #0

## 2023-02-15 MED ORDER — ENTRESTO 49-51 MG PO TABS
1.0000 | ORAL_TABLET | Freq: Two times a day (BID) | ORAL | 3 refills | Status: AC
  Filled 2023-02-15 (×2): qty 60, 30d supply, fill #0

## 2023-02-15 MED ORDER — METOPROLOL SUCCINATE ER 100 MG PO TB24
100.0000 mg | ORAL_TABLET | Freq: Every day | ORAL | 3 refills | Status: AC
  Filled 2023-02-15: qty 30, 30d supply, fill #0

## 2023-02-15 NOTE — Progress Notes (Unsigned)
PCP: [facility] (last seen 06/24) Primary Cardiologist: Willene Hatchet, MD (last seen 11/23)  HPI:  Andrew Walters is a 42 y/o male with a history of HTN, depression, current tobacco use and chronic heart failure.   Echo 04/29/21: EF of 25-30% along with mild LVH, severe LAE and mild Andrew.  Echo 05/18/22: EF 55% along with mild AR.   Has not been admitted or been in the ED in the last 6 months.   He presents today for a HF follow-up visit with a chief complaint of moderate shortness of breath with minimal exertion. Chronic in nature although has worsened over the last couple of months. Has associated fatigue, intermittent chest pain, knee/ left shoulder pain, intermittent dizziness, headache and difficulty sleeping along with this. Denies palpitations, abdominal distention or pedal edema.   Says that he's been out of all his medications for ~ 2 months. Says that he thinks his PCP sent them to the wrong pharmacy and he has called them back and asked for them to be transferred to [facility].   Says that he currently doesn't have any insurance and can't afford his meds. When PharmD checked, he has 2 policies, one from the [employer] and an [employer]. He didn't seem to recall either of these policies.   ROS: All systems negative except as listed in HPI, PMH and Problem List.  SH:  Social History   Socioeconomic History   Marital status: Single    Spouse name: Not on file   Number of children: Not on file   Years of education: Not on file   Highest education level: Not on file  Occupational History   Not on file  Tobacco Use   Smoking status: Every Day   Smokeless tobacco: Never  Substance and Sexual Activity   Alcohol use: No   Drug use: Not Currently    Types: Marijuana   Sexual activity: Not on file  Other Topics Concern   Not on file  Social History Narrative   Not on file   Social Determinants of Health   Financial Resource Strain: High Risk  (01/22/2022)   Overall Financial Resource Strain (CARDIA)    Difficulty of Paying Living Expenses: Very hard  Food Insecurity: Food Insecurity Present (01/22/2022)   Hunger Vital Sign    Worried About Running Out of Food in the Last Year: Sometimes true    Ran Out of Food in the Last Year: Sometimes true  Transportation Needs: No Transportation Needs (01/22/2022)   PRAPARE - Administrator, Civil Service (Medical): No    Lack of Transportation (Non-Medical): No  Physical Activity: Not on file  Stress: Not on file  Social Connections: Not on file  Intimate Partner Violence: Not on file    FH:  Family History  Problem Relation Age of Onset   Sleep apnea Brother     Past Medical History:  Diagnosis Date   CHF (congestive heart failure) (HCC)    Hypertension     Current Outpatient Medications  Medication Sig Dispense Refill   atorvastatin (LIPITOR) 20 MG tablet Take 20 mg by mouth daily.     empagliflozin (JARDIANCE) 10 MG TABS tablet Take 1 tablet (10 mg total) by mouth once daily before breakfast. 90 tablet 3   empagliflozin (JARDIANCE) 10 MG TABS tablet Take 1 tablet (10 mg total) by mouth daily. 90 tablet 3   metoprolol succinate (TOPROL-XL) 100 MG 24 hr tablet Take 1 tablet (100 mg total)  by mouth daily. 90 tablet 3   metoprolol succinate (TOPROL-XL) 50 MG 24 hr tablet Take 1 tablet (50 mg total) by mouth once daily. Take with or immediately following a meal. 90 tablet 3   potassium chloride SA (KLOR-CON M) 20 MEQ tablet Take 2 tablets (40 mEq total) by mouth 2 (two) times daily. 120 tablet 5   sacubitril-valsartan (ENTRESTO) 49-51 MG Take 1 tablet by mouth 2 (two) times daily.     sacubitril-valsartan (ENTRESTO) 49-51 MG Take 1 tablet by mouth 2 (two) times daily. 180 tablet 3   spironolactone (ALDACTONE) 25 MG tablet Take 1 tablet (25 mg total) by mouth daily. 90 tablet 3   torsemide (DEMADEX) 20 MG tablet Take 2 tablets (40 mg total) by mouth 2 (two) times daily.  120 tablet 5   No current facility-administered medications for this visit.   Vitals:   02/15/23 1409 02/15/23 1410  BP: (!) 191/114 (!) 182/104  Pulse: 88   SpO2: 98%   Weight: 249 lb (112.9 kg)    Wt Readings from Last 3 Encounters:  02/15/23 249 lb (112.9 kg)  10/29/22 247 lb 6 oz (112.2 kg)  10/27/22 252 lb 8 oz (114.5 kg)   Lab Results  Component Value Date   CREATININE 1.83 (H) 10/29/2022   CREATININE 1.37 (H) 10/26/2022   CREATININE 1.34 (H) 10/27/2021   PHYSICAL EXAM:  General:  Well appearing. No resp difficulty HEENT: normal Neck: supple. JVP flat. No lymphadenopathy or thryomegaly appreciated. Cor: PMI normal. Regular rate & rhythm. No rubs, gallops or murmurs. Lungs: clear Abdomen: soft, nontender, nondistended. No hepatosplenomegaly. No bruits or masses.  Extremities: no cyanosis, clubbing, rash, edema Neuro: alert & oriented x3, cranial nerves grossly intact. Moves all 4 extremities w/o difficulty. Affect pleasant.   ECG: today is NSR with HR 72   ASSESSMENT & PLAN:  1: NICM with preserved ejection fraction- - suspect due to HTN/ untreated OSA - NYHA class III - euvolemic - not weighing daily; encouraged to resume and to call for an overnight weight gain of > 2 pounds or a weekly weight gain of >5 pounds - weight up 2 pounds from last visit here 3 months ago - Echo 04/29/21: EF of 25-30% along with mild LVH, severe LAE and mild Andrew.  - Echo 05/18/22: EF 55% along with mild AR.  - resume torsemide 40mg  BID - resume potassium BID - resume entresto 49/51mg  BID  - resume jardiance 10mg  daily  - resume metoprolol succinate 100mg  daily - resume spironolactone 25mg  daily - RN called [employer] and has all the RX from his other pharmacy so he can go pick them up today; samples provided of entresto and jardiance and 1 dose of entresto was given while he was in the office - unclear how much fluid he drinks and he can't quantify it; "stays  thirsty" - saw HF cardiology Jaymes Graff) 11/23; returns tomorrow - BNP 10/26/22 was 28.7  2: HTN- - BP 182/104 manually  - has been off all meds for ~ 2 months but will be resuming them all today; 1 entresto given in office and he was instructed to take his 2nd dose of entresto later today along with all his other meds once he picks them up - saw PCP @ [facility] virtually on 06/25 - BMP 10/29/22 showed sodium 139, potassium 3.7, creatinine 1.83 and GFR 47 - check BMP next visit  3: Tobacco use- - denies tobacco for ~ several months -  denies alcohol/drug use  4: Depression/ stress- - endorses a lot of stress as he deals with the health of the mother of his 2 young kids - emotional support given  5: Snoring- - saw pulmonology Craige Cotta) 01/24 & was supposed to get a home sleep study set up - will check at next visit to see if his insurance will pay for a home sleep study  Return in 2 weeks, sooner if needed.

## 2023-02-15 NOTE — Patient Instructions (Signed)
Go to the [facility] across the street and get your medications. They will have everything except jardiance & entresto (which we gave you today).    Take your second dose of entresto tonight before you go to bed.

## 2023-02-16 ENCOUNTER — Encounter: Payer: Self-pay | Admitting: Family

## 2023-03-02 ENCOUNTER — Encounter: Payer: Self-pay | Admitting: Family

## 2023-03-02 ENCOUNTER — Telehealth: Payer: Self-pay | Admitting: Family

## 2023-03-02 NOTE — Telephone Encounter (Signed)
Patient did not show for his Heart Failure Clinic appointment on 03/02/23. This is the 9th appt he has missed

## 2023-03-02 NOTE — Progress Notes (Deleted)
PCP: [facility] (last seen 06/24) Primary Cardiologist: Willene Hatchet, MD (last seen 11/23)  HPI:  Andrew Walters is a 42 y/o male with a history of HTN, depression, current tobacco use and chronic heart failure.   Echo 04/29/21: EF of 25-30% along with mild LVH, severe LAE and mild Andrew.  Echo 05/18/22: EF 55% along with mild AR.   Has not been admitted or been in the ED in the last 6 months.   He presents today for a HF follow-up visit with a chief complaint of   All meds were resumed at last visit.   Says that he currently doesn't have any insurance and can't afford his meds. When PharmD checked, he has 2 policies, one from the [employer] and an [employer]. He didn't seem to recall either of these policies.   ROS: All systems negative except as listed in HPI, PMH and Problem List.  SH:  Social History   Socioeconomic History   Marital status: Single    Spouse name: Not on file   Number of children: Not on file   Years of education: Not on file   Highest education level: Not on file  Occupational History   Not on file  Tobacco Use   Smoking status: Every Day   Smokeless tobacco: Never  Substance and Sexual Activity   Alcohol use: No   Drug use: Not Currently    Types: Marijuana   Sexual activity: Not on file  Other Topics Concern   Not on file  Social History Narrative   Not on file   Social Determinants of Health   Financial Resource Strain: High Risk (01/22/2022)   Overall Financial Resource Strain (CARDIA)    Difficulty of Paying Living Expenses: Very hard  Food Insecurity: Food Insecurity Present (01/22/2022)   Hunger Vital Sign    Worried About Running Out of Food in the Last Year: Sometimes true    Ran Out of Food in the Last Year: Sometimes true  Transportation Needs: No Transportation Needs (01/22/2022)   PRAPARE - Administrator, Civil Service (Medical): No    Lack of Transportation (Non-Medical): No  Physical Activity: Not on file   Stress: Not on file  Social Connections: Not on file  Intimate Partner Violence: Not on file    FH:  Family History  Problem Relation Age of Onset   Sleep apnea Brother     Past Medical History:  Diagnosis Date   CHF (congestive heart failure) (HCC)    Hypertension     Current Outpatient Medications  Medication Sig Dispense Refill   atorvastatin (LIPITOR) 20 MG tablet Take 20 mg by mouth daily. (Patient not taking: Reported on 02/15/2023)     empagliflozin (JARDIANCE) 10 MG TABS tablet Take 1 tablet (10 mg total) by mouth daily. (Patient not taking: Reported on 02/15/2023) 90 tablet 3   metoprolol succinate (TOPROL-XL) 100 MG 24 hr tablet Take 1 tablet (100 mg total) by mouth daily. (Patient not taking: Reported on 02/15/2023) 90 tablet 3   potassium chloride SA (KLOR-CON M) 20 MEQ tablet Take 2 tablets (40 mEq total) by mouth 2 (two) times daily. (Patient not taking: Reported on 02/15/2023) 120 tablet 5   sacubitril-valsartan (ENTRESTO) 49-51 MG Take 1 tablet by mouth 2 (two) times daily. (Patient not taking: Reported on 02/15/2023) 180 tablet 3   spironolactone (ALDACTONE) 25 MG tablet Take 1 tablet (25 mg total) by mouth daily. (Patient not taking: Reported on 02/15/2023) 90 tablet  3   torsemide (DEMADEX) 20 MG tablet Take 2 tablets (40 mg total) by mouth 2 (two) times daily. (Patient not taking: Reported on 02/15/2023) 120 tablet 5   No current facility-administered medications for this visit.     PHYSICAL EXAM:  General:  Well appearing. No resp difficulty HEENT: normal Neck: supple. JVP flat. No lymphadenopathy or thryomegaly appreciated. Cor: PMI normal. Regular rate & rhythm. No rubs, gallops or murmurs. Lungs: clear Abdomen: soft, nontender, nondistended. No hepatosplenomegaly. No bruits or masses.  Extremities: no cyanosis, clubbing, rash, edema Neuro: alert & oriented x3, cranial nerves grossly intact. Moves all 4 extremities w/o difficulty. Affect pleasant.   ECG:  not done   ASSESSMENT & PLAN:  1: NICM with preserved ejection fraction- - suspect due to HTN/ untreated OSA - NYHA class III - euvolemic - not weighing daily; encouraged to resume and to call for an overnight weight gain of > 2 pounds or a weekly weight gain of >5 pounds - weight 249 pounds from last visit here 2 weeks ago - Echo 04/29/21: EF of 25-30% along with mild LVH, severe LAE and mild Andrew.  - Echo 05/18/22: EF 55% along with mild AR.  - continue torsemide 40mg  BID - continue potassium BID - continue entresto 49/51mg  BID  - continue jardiance 10mg  daily  - continue metoprolol succinate 100mg  daily - continue spironolactone 25mg  daily - BMP today - unclear how much fluid he drinks and he can't quantify it; "stays thirsty" - saw HF cardiology Jaymes Graff) 11/23 - BNP 10/26/22 was 28.7  2: HTN- - BP  - saw PCP @ [facility] virtually on 06/25 - BMP 10/29/22 showed sodium 139, potassium 3.7, creatinine 1.83 and GFR 47 - BMP today since all meds were resumed at last visit  3: Tobacco use- - denies tobacco for ~ several months - denies alcohol/drug use  4: Depression/ stress- - endorses a lot of stress as he deals with the health of the mother of his 2 young kids - emotional support given  5: Snoring- - saw pulmonology Craige Cotta) 01/24 & was supposed to get a home sleep study set up - will check at next visit to see if his insurance will pay for a home sleep study

## 2023-03-03 ENCOUNTER — Other Ambulatory Visit: Payer: Self-pay

## 2023-03-05 ENCOUNTER — Other Ambulatory Visit: Payer: Self-pay

## 2023-03-12 ENCOUNTER — Other Ambulatory Visit: Payer: Self-pay

## 2023-03-23 DIAGNOSIS — E782 Mixed hyperlipidemia: Secondary | ICD-10-CM | POA: Diagnosis not present

## 2023-03-23 DIAGNOSIS — I428 Other cardiomyopathies: Secondary | ICD-10-CM | POA: Diagnosis not present

## 2023-03-23 DIAGNOSIS — N529 Male erectile dysfunction, unspecified: Secondary | ICD-10-CM | POA: Diagnosis not present

## 2023-04-11 ENCOUNTER — Emergency Department: Payer: 59

## 2023-04-11 ENCOUNTER — Encounter: Payer: Self-pay | Admitting: Emergency Medicine

## 2023-04-11 ENCOUNTER — Other Ambulatory Visit: Payer: Self-pay

## 2023-04-11 ENCOUNTER — Emergency Department
Admission: EM | Admit: 2023-04-11 | Discharge: 2023-04-11 | Disposition: A | Discharge status: Home / Self Care | Payer: 59 | Attending: Emergency Medicine | Admitting: Emergency Medicine

## 2023-04-11 DIAGNOSIS — Z743 Need for continuous supervision: Secondary | ICD-10-CM | POA: Diagnosis not present

## 2023-04-11 DIAGNOSIS — I110 Hypertensive heart disease with heart failure: Secondary | ICD-10-CM | POA: Insufficient documentation

## 2023-04-11 DIAGNOSIS — I509 Heart failure, unspecified: Secondary | ICD-10-CM | POA: Insufficient documentation

## 2023-04-11 DIAGNOSIS — N281 Cyst of kidney, acquired: Secondary | ICD-10-CM | POA: Diagnosis not present

## 2023-04-11 DIAGNOSIS — N200 Calculus of kidney: Secondary | ICD-10-CM | POA: Diagnosis not present

## 2023-04-11 DIAGNOSIS — I10 Essential (primary) hypertension: Secondary | ICD-10-CM | POA: Diagnosis not present

## 2023-04-11 DIAGNOSIS — R109 Unspecified abdominal pain: Secondary | ICD-10-CM | POA: Diagnosis not present

## 2023-04-11 LAB — URINALYSIS, ROUTINE W REFLEX MICROSCOPIC
Bacteria, UA: NONE SEEN
Bilirubin Urine: NEGATIVE
Glucose, UA: >=500 mg/dL — AB
Ketones, ur: NEGATIVE mg/dL
Leukocytes,Ua: NEGATIVE
Nitrite: NEGATIVE
Protein, ur: NEGATIVE mg/dL
Specific Gravity, Urine: 1.003 — ABNORMAL LOW (ref 1.005–1.030)
pH: 5 (ref 5.0–8.0)

## 2023-04-11 MED ORDER — OXYCODONE-ACETAMINOPHEN 5-325 MG PO TABS
1.0000 | ORAL_TABLET | Freq: Once | ORAL | Status: AC
  Administered 2023-04-11: 1 via ORAL
  Filled 2023-04-11: qty 1

## 2023-04-11 MED ORDER — TAMSULOSIN HCL 0.4 MG PO CAPS: 0.4000 mg | ORAL_CAPSULE | Freq: Every day | ORAL | 0 refills | Status: DC

## 2023-04-11 MED ORDER — KETOROLAC TROMETHAMINE 30 MG/ML IJ SOLN
30.0000 mg | Freq: Once | INTRAMUSCULAR | Status: AC
  Administered 2023-04-11: 30 mg via INTRAMUSCULAR
  Filled 2023-04-11: qty 1

## 2023-04-11 MED ORDER — OXYCODONE-ACETAMINOPHEN 5-325 MG PO TABS: 1.0000 | ORAL_TABLET | ORAL | 0 refills | Status: DC | PRN

## 2023-04-11 NOTE — ED Provider Notes (Signed)
[facility] Provider Note    Event Date/Time   First MD Initiated Contact with Patient 04/11/23 1619     (approximate)   History   Back Pain   HPI  Andrew Walters is a 42 y.o. male  with PMH of CHF and HTN presents for evaluation of abdominal pain for one week. Patient states it is in his LLQ and is down into his groin.  Patient denies fever, nausea, vomiting, constipation and urinary symptoms.  He does endorse some diarrhea.     Physical Exam   Triage Vital Signs: ED Triage Vitals  Encounter Vitals Group     BP 04/11/23 1533 (!) 157/114     Systolic BP Percentile --      Diastolic BP Percentile --      Pulse Rate 04/11/23 1533 90     Resp 04/11/23 1533 18     Temp 04/11/23 1533 98.4 F (36.9 C)     Temp Source 04/11/23 1533 Oral     SpO2 04/11/23 1533 100 %     Weight 04/11/23 1531 240 lb (108.9 kg)     Height 04/11/23 1531 5\' 11"  (1.803 m)     Head Circumference --      Peak Flow --      Pain Score 04/11/23 1531 10     Pain Loc --      Pain Education --      Exclude from Growth Chart --     Most recent vital signs: Vitals:   04/11/23 1533  BP: (!) 157/114  Pulse: 90  Resp: 18  Temp: 98.4 F (36.9 C)  SpO2: 100%    General: Awake, moderate distress. CV:  Good peripheral perfusion. RRR. Resp:  Normal effort.  CTAB. Abd:  No distention.  Tender to palpation in left lower quadrant.  Soft.   ED Results / Procedures / Treatments   Labs (all labs ordered are listed, but only abnormal results are displayed) Labs Reviewed  URINALYSIS, ROUTINE W REFLEX MICROSCOPIC - Abnormal; Notable for the following components:      Result Value   Color, Urine COLORLESS (*)    APPearance CLEAR (*)    Specific Gravity, Urine 1.003 (*)    Glucose, UA >=500 (*)    Hgb urine dipstick SMALL (*)    All other components within normal limits    RADIOLOGY  Renal stone study obtained, interpreted the images as well as reviewed the radiologist report.   Images show a 2 mm punctate nonobstructing right renal calculus.  PROCEDURES:  Critical Care performed: No  Procedures   MEDICATIONS ORDERED IN ED: Medications  ketorolac (TORADOL) 30 MG/ML injection 30 mg (30 mg Intramuscular Given 04/11/23 1653)  oxyCODONE-acetaminophen (PERCOCET/ROXICET) 5-325 MG per tablet 1 tablet (1 tablet Oral Given 04/11/23 1653)     IMPRESSION / MDM / ASSESSMENT AND PLAN / ED COURSE  I reviewed the triage vital signs and the nursing notes.                             42 year old male presents for evaluation of abdominal pain.  Patient was hypertensive in triage otherwise VSS.  Patient in moderate distress on exam due to pain.  Differential diagnosis includes, but is not limited to, nephrolithiasis, pyelonephritis, muscle strain, diverticulitis, inflammatory bowel disease, irritable bowel disease.  Patient's presentation is most consistent with acute complicated illness / injury requiring diagnostic workup.  Urinalysis notable for  greater than 500 of glucose and small hemoglobin.  CT renal stone study obtained, I interpreted the images as well as reviewed the radiologist report which shows a 2 mm punctate nonobstructing right renal calculus.   Patient was given Toradol and Percocet to control his pain while in the ED.  Patient will be given a prescription for tamsulosin and pain medication.  I instructed him to follow-up with urology next week.  I also advised him to follow-up with his primary care provider regarding the large amount of glucose in his urine.  Patient was agreeable to plan, voiced understanding and was stable at discharge.   FINAL CLINICAL IMPRESSION(S) / ED DIAGNOSES   Final diagnoses:  Nephrolithiasis     Rx / DC Orders   ED Discharge Orders          Ordered    tamsulosin (FLOMAX) 0.4 MG CAPS capsule  Daily        04/11/23 1733    oxyCODONE-acetaminophen (PERCOCET) 5-325 MG tablet  Every 4 hours PRN        04/11/23 1733              Note:  This document was prepared using Dragon voice recognition software and may include unintentional dictation errors.   Cameron Ali, PA-C 04/11/23 1735    Corena Herter, MD 04/11/23 2322

## 2023-04-11 NOTE — ED Triage Notes (Signed)
Pt via POV from home. Pt c/o R sided back pain that radiates down his R leg. Denies hx of sciatica. Denies NVD. Denies urinary symptoms. Pt is A&Ox4 and NAD.

## 2023-04-11 NOTE — Discharge Instructions (Addendum)
Your CT scan showed that you have a kidney stone.  Please take the tamsulosin once a day.  You can take the Percocet every 4 hours as needed for severe pain.  Please keep in mind that this medication can be sedating so you cannot drive after taking it.  You can take Tylenol or ibuprofen as needed for mild to moderate pain.  Please schedule an appointment with Dr. Lonna Cobb for follow-up next week.  Your urine did show large amounts of glucose, please follow-up with your primary care provider regarding this.

## 2023-04-11 NOTE — ED Notes (Signed)
First nurse note: Pt here via [facility] fromh ome with c/o of L sided flank pain. Pt states pain has been ongoing for 1 week.  167/115 H: 91 97%

## 2023-04-18 ENCOUNTER — Emergency Department: Payer: 59

## 2023-04-18 ENCOUNTER — Other Ambulatory Visit: Payer: Self-pay

## 2023-04-18 ENCOUNTER — Encounter: Payer: Self-pay | Admitting: Emergency Medicine

## 2023-04-18 DIAGNOSIS — R55 Syncope and collapse: Principal | ICD-10-CM | POA: Insufficient documentation

## 2023-04-18 DIAGNOSIS — Z7984 Long term (current) use of oral hypoglycemic drugs: Secondary | ICD-10-CM | POA: Diagnosis not present

## 2023-04-18 DIAGNOSIS — I130 Hypertensive heart and chronic kidney disease with heart failure and stage 1 through stage 4 chronic kidney disease, or unspecified chronic kidney disease: Secondary | ICD-10-CM | POA: Insufficient documentation

## 2023-04-18 DIAGNOSIS — Z23 Encounter for immunization: Secondary | ICD-10-CM | POA: Diagnosis not present

## 2023-04-18 DIAGNOSIS — I428 Other cardiomyopathies: Secondary | ICD-10-CM | POA: Diagnosis not present

## 2023-04-18 DIAGNOSIS — N400 Enlarged prostate without lower urinary tract symptoms: Secondary | ICD-10-CM | POA: Diagnosis not present

## 2023-04-18 DIAGNOSIS — Z79899 Other long term (current) drug therapy: Secondary | ICD-10-CM | POA: Insufficient documentation

## 2023-04-18 DIAGNOSIS — F17210 Nicotine dependence, cigarettes, uncomplicated: Secondary | ICD-10-CM | POA: Diagnosis not present

## 2023-04-18 DIAGNOSIS — I509 Heart failure, unspecified: Secondary | ICD-10-CM | POA: Insufficient documentation

## 2023-04-18 DIAGNOSIS — E785 Hyperlipidemia, unspecified: Secondary | ICD-10-CM | POA: Diagnosis not present

## 2023-04-18 DIAGNOSIS — R4182 Altered mental status, unspecified: Secondary | ICD-10-CM | POA: Diagnosis not present

## 2023-04-18 DIAGNOSIS — N1831 Chronic kidney disease, stage 3a: Secondary | ICD-10-CM | POA: Diagnosis not present

## 2023-04-18 LAB — CBC
HCT: 49 % (ref 39.0–52.0)
Hemoglobin: 16.4 g/dL (ref 13.0–17.0)
MCH: 31.9 pg (ref 26.0–34.0)
MCHC: 33.5 g/dL (ref 30.0–36.0)
MCV: 95.3 fL (ref 80.0–100.0)
Platelets: 309 10*3/uL (ref 150–400)
RBC: 5.14 MIL/uL (ref 4.22–5.81)
RDW: 14.1 % (ref 11.5–15.5)
WBC: 6.5 10*3/uL (ref 4.0–10.5)
nRBC: 0 % (ref 0.0–0.2)

## 2023-04-18 NOTE — ED Notes (Signed)
Patient does report recently starting new medication; unable to recall name or what he is taking it for.

## 2023-04-18 NOTE — ED Triage Notes (Signed)
Pt in via [facility] from home, wife reports he was in the kitchen helping her, he suddenly started shaking as if he was having a seizure and collapsed.  States he was down for approximately 3-4 minutes.  Patient denies any complaints right now.  A/Ox4.  Per EMS: CBG 153

## 2023-04-19 ENCOUNTER — Observation Stay: Payer: 59

## 2023-04-19 ENCOUNTER — Observation Stay (HOSPITAL_BASED_OUTPATIENT_CLINIC_OR_DEPARTMENT_OTHER)
Admit: 2023-04-19 | Discharge: 2023-04-19 | Disposition: A | Discharge status: Home / Self Care | Payer: 59 | Attending: Family Medicine | Admitting: Family Medicine

## 2023-04-19 ENCOUNTER — Observation Stay
Admission: EM | Admit: 2023-04-19 | Discharge: 2023-04-20 | Disposition: A | Discharge status: Home / Self Care | Payer: 59 | Attending: Obstetrics and Gynecology | Admitting: Obstetrics and Gynecology

## 2023-04-19 DIAGNOSIS — I10 Essential (primary) hypertension: Secondary | ICD-10-CM

## 2023-04-19 DIAGNOSIS — R55 Syncope and collapse: Principal | ICD-10-CM

## 2023-04-19 DIAGNOSIS — F1910 Other psychoactive substance abuse, uncomplicated: Secondary | ICD-10-CM | POA: Diagnosis not present

## 2023-04-19 DIAGNOSIS — I6523 Occlusion and stenosis of bilateral carotid arteries: Secondary | ICD-10-CM | POA: Diagnosis not present

## 2023-04-19 DIAGNOSIS — E785 Hyperlipidemia, unspecified: Secondary | ICD-10-CM | POA: Diagnosis not present

## 2023-04-19 DIAGNOSIS — N400 Enlarged prostate without lower urinary tract symptoms: Secondary | ICD-10-CM | POA: Insufficient documentation

## 2023-04-19 DIAGNOSIS — I428 Other cardiomyopathies: Secondary | ICD-10-CM

## 2023-04-19 LAB — URINE DRUG SCREEN, QUALITATIVE (ARMC ONLY)
Amphetamines, Ur Screen: NOT DETECTED
Barbiturates, Ur Screen: NOT DETECTED
Benzodiazepine, Ur Scrn: NOT DETECTED
Cannabinoid 50 Ng, Ur ~~LOC~~: POSITIVE — AB
Cocaine Metabolite,Ur ~~LOC~~: POSITIVE — AB
MDMA (Ecstasy)Ur Screen: NOT DETECTED
Methadone Scn, Ur: NOT DETECTED
Opiate, Ur Screen: NOT DETECTED
Phencyclidine (PCP) Ur S: NOT DETECTED
Tricyclic, Ur Screen: NOT DETECTED

## 2023-04-19 LAB — URINALYSIS, ROUTINE W REFLEX MICROSCOPIC
Bacteria, UA: NONE SEEN
Bilirubin Urine: NEGATIVE
Glucose, UA: >=500 mg/dL — AB
Ketones, ur: NEGATIVE mg/dL
Leukocytes,Ua: NEGATIVE
Nitrite: NEGATIVE
Protein, ur: NEGATIVE mg/dL
Specific Gravity, Urine: 1.009 (ref 1.005–1.030)
pH: 5 (ref 5.0–8.0)

## 2023-04-19 LAB — BASIC METABOLIC PANEL
Anion gap: 11 (ref 5–15)
Anion gap: 14 (ref 5–15)
BUN: 20 mg/dL (ref 6–20)
BUN: 21 mg/dL — ABNORMAL HIGH (ref 6–20)
CO2: 25 mmol/L (ref 22–32)
CO2: 26 mmol/L (ref 22–32)
Calcium: 9.2 mg/dL (ref 8.9–10.3)
Calcium: 9.3 mg/dL (ref 8.9–10.3)
Chloride: 102 mmol/L (ref 98–111)
Chloride: 98 mmol/L (ref 98–111)
Creatinine, Ser: 1.64 mg/dL — ABNORMAL HIGH (ref 0.61–1.24)
Creatinine, Ser: 1.78 mg/dL — ABNORMAL HIGH (ref 0.61–1.24)
GFR, Estimated: 48 mL/min — ABNORMAL LOW (ref >60)
GFR, Estimated: 53 mL/min — ABNORMAL LOW (ref >60)
Glucose, Bld: 125 mg/dL — ABNORMAL HIGH (ref 70–99)
Glucose, Bld: 135 mg/dL — ABNORMAL HIGH (ref 70–99)
Potassium: 3.7 mmol/L (ref 3.5–5.1)
Potassium: 4.4 mmol/L (ref 3.5–5.1)
Sodium: 137 mmol/L (ref 135–145)
Sodium: 139 mmol/L (ref 135–145)

## 2023-04-19 LAB — HIV ANTIBODY (ROUTINE TESTING W REFLEX): HIV Screen 4th Generation wRfx: NONREACTIVE

## 2023-04-19 LAB — CBC
HCT: 48 % (ref 39.0–52.0)
Hemoglobin: 16.9 g/dL (ref 13.0–17.0)
MCH: 32.7 pg (ref 26.0–34.0)
MCHC: 35.2 g/dL (ref 30.0–36.0)
MCV: 92.8 fL (ref 80.0–100.0)
Platelets: 312 10*3/uL (ref 150–400)
RBC: 5.17 MIL/uL (ref 4.22–5.81)
RDW: 14.1 % (ref 11.5–15.5)
WBC: 7.8 10*3/uL (ref 4.0–10.5)
nRBC: 0 % (ref 0.0–0.2)

## 2023-04-19 LAB — ECHOCARDIOGRAM COMPLETE
AR max vel: 2.63 cm2
AV Area VTI: 2.39 cm2
AV Area mean vel: 2.37 cm2
AV Mean grad: 5 mmHg
AV Peak grad: 9 mmHg
Ao pk vel: 1.5 m/s
Area-P 1/2: 2.34 cm2
Height: 71 in
MV VTI: 2.7 cm2
S' Lateral: 3.6 cm
Weight: 3837.77 oz

## 2023-04-19 LAB — TROPONIN I (HIGH SENSITIVITY)
Troponin I (High Sensitivity): 17 ng/L (ref <18)
Troponin I (High Sensitivity): 12 ng/L (ref <18)

## 2023-04-19 LAB — GLUCOSE, CAPILLARY: Glucose-Capillary: 150 mg/dL — ABNORMAL HIGH (ref 70–99)

## 2023-04-19 MED ORDER — ACETAMINOPHEN 650 MG RE SUPP: 650.0000 mg | Freq: Four times a day (QID) | RECTAL | Status: DC | PRN

## 2023-04-19 MED ORDER — SODIUM CHLORIDE 0.9% FLUSH
3.0000 mL | Freq: Two times a day (BID) | INTRAVENOUS | Status: DC
  Administered 2023-04-19 – 2023-04-20 (×4): 3 mL via INTRAVENOUS

## 2023-04-19 MED ORDER — TRAZODONE HCL 50 MG PO TABS: 25.0000 mg | ORAL_TABLET | Freq: Every evening | ORAL | Status: DC | PRN

## 2023-04-19 MED ORDER — SPIRONOLACTONE 25 MG PO TABS
25.0000 mg | ORAL_TABLET | Freq: Every day | ORAL | Status: DC
  Administered 2023-04-19 – 2023-04-20 (×2): 25 mg via ORAL
  Filled 2023-04-19 (×2): qty 1

## 2023-04-19 MED ORDER — OXYCODONE-ACETAMINOPHEN 5-325 MG PO TABS
1.0000 | ORAL_TABLET | ORAL | Status: DC | PRN
  Administered 2023-04-20: 1 via ORAL
  Filled 2023-04-19: qty 1

## 2023-04-19 MED ORDER — MAGNESIUM HYDROXIDE 400 MG/5ML PO SUSP
30.0000 mL | Freq: Every day | ORAL | Status: DC | PRN
  Filled 2023-04-19: qty 30

## 2023-04-19 MED ORDER — SODIUM CHLORIDE 0.9 % IV SOLN: INTRAVENOUS | Status: DC

## 2023-04-19 MED ORDER — ATORVASTATIN CALCIUM 20 MG PO TABS
40.0000 mg | ORAL_TABLET | Freq: Every day | ORAL | Status: DC
  Administered 2023-04-19 – 2023-04-20 (×2): 40 mg via ORAL
  Filled 2023-04-19 (×2): qty 2

## 2023-04-19 MED ORDER — POTASSIUM CHLORIDE 20 MEQ PO PACK
40.0000 meq | PACK | Freq: Once | ORAL | Status: AC
  Administered 2023-04-20: 40 meq via ORAL
  Filled 2023-04-19: qty 2

## 2023-04-19 MED ORDER — ONDANSETRON HCL 4 MG/2ML IJ SOLN: 4.0000 mg | Freq: Four times a day (QID) | INTRAMUSCULAR | Status: DC | PRN

## 2023-04-19 MED ORDER — EMPAGLIFLOZIN 10 MG PO TABS
10.0000 mg | ORAL_TABLET | Freq: Every day | ORAL | Status: DC
  Administered 2023-04-19 – 2023-04-20 (×2): 10 mg via ORAL
  Filled 2023-04-19 (×2): qty 1

## 2023-04-19 MED ORDER — PNEUMOCOCCAL 20-VAL CONJ VACC 0.5 ML IM SUSY
0.5000 mL | PREFILLED_SYRINGE | INTRAMUSCULAR | Status: AC
  Administered 2023-04-20: 0.5 mL via INTRAMUSCULAR
  Filled 2023-04-19: qty 0.5

## 2023-04-19 MED ORDER — METOPROLOL SUCCINATE ER 100 MG PO TB24
100.0000 mg | ORAL_TABLET | Freq: Every day | ORAL | Status: DC
  Administered 2023-04-19 – 2023-04-20 (×2): 100 mg via ORAL
  Filled 2023-04-19 (×2): qty 1

## 2023-04-19 MED ORDER — ACETAMINOPHEN 325 MG PO TABS: 650.0000 mg | ORAL_TABLET | Freq: Four times a day (QID) | ORAL | Status: DC | PRN

## 2023-04-19 MED ORDER — ONDANSETRON HCL 4 MG PO TABS: 4.0000 mg | ORAL_TABLET | Freq: Four times a day (QID) | ORAL | Status: DC | PRN

## 2023-04-19 MED ORDER — SACUBITRIL-VALSARTAN 49-51 MG PO TABS
1.0000 | ORAL_TABLET | Freq: Two times a day (BID) | ORAL | Status: DC
  Administered 2023-04-19 – 2023-04-20 (×3): 1 via ORAL
  Filled 2023-04-19 (×3): qty 1

## 2023-04-19 MED ORDER — INFLUENZA VIRUS VACC SPLIT PF (FLUZONE) 0.5 ML IM SUSY
0.5000 mL | PREFILLED_SYRINGE | INTRAMUSCULAR | Status: AC
  Administered 2023-04-20: 0.5 mL via INTRAMUSCULAR
  Filled 2023-04-19: qty 0.5

## 2023-04-19 MED ORDER — ENOXAPARIN SODIUM 60 MG/0.6ML IJ SOSY
55.0000 mg | PREFILLED_SYRINGE | INTRAMUSCULAR | Status: DC
  Administered 2023-04-19 – 2023-04-20 (×2): 55 mg via SUBCUTANEOUS
  Filled 2023-04-19 (×2): qty 0.6

## 2023-04-19 MED ORDER — TAMSULOSIN HCL 0.4 MG PO CAPS
0.4000 mg | ORAL_CAPSULE | Freq: Every day | ORAL | Status: DC
  Administered 2023-04-19 – 2023-04-20 (×2): 0.4 mg via ORAL
  Filled 2023-04-19 (×2): qty 1

## 2023-04-19 NOTE — ED Provider Notes (Signed)
[facility] Provider Note    Event Date/Time   First MD Initiated Contact with Patient 04/19/23 [phone]     (approximate)   History   Loss of Consciousness   HPI  Andrew Walters is a 42 y.o. male who presents to the ED for evaluation of Loss of Consciousness   I review a cardiology clinic visit from 1 month ago.  History of nonischemic cardiomyopathy with an EF of 25% in 2022, repeat echo last year with normalized EF, moderate LVH.  Patient presents to the ED for evaluation of a syncopal episode.  Reports being at his baseline recently, he was standing in the kitchen assisting his wife cooking dinner when he had rapid onset dizziness and a syncopal episode "out of nowhere."  No chest pain or headache, reports feeling okay now but is not sure what happened.   Physical Exam   Triage Vital Signs: ED Triage Vitals  Encounter Vitals Group     BP 04/18/23 2309 95/66     Systolic BP Percentile --      Diastolic BP Percentile --      Pulse Rate 04/18/23 2309 89     Resp 04/18/23 2309 18     Temp 04/18/23 2309 98.5 F (36.9 C)     Temp Source 04/18/23 2309 Oral     SpO2 04/18/23 2309 96 %     Weight 04/18/23 2305 240 lb (108.9 kg)     Height 04/18/23 2305 5\' 11"  (1.803 m)     Head Circumference --      Peak Flow --      Pain Score 04/18/23 2305 0     Pain Loc --      Pain Education --      Exclude from Growth Chart --     Most recent vital signs: Vitals:   04/19/23 0431 04/19/23 0433  BP: 137/88 119/76  Pulse: 81 80  Resp:    Temp:    SpO2: 93% 93%    General: Awake, no distress.  CV:  Good peripheral perfusion.  Resp:  Normal effort.  Abd:  No distention.  MSK:  No deformity noted.  Neuro:  No focal deficits appreciated. Other:     ED Results / Procedures / Treatments   Labs (all labs ordered are listed, but only abnormal results are displayed) Labs Reviewed  URINALYSIS, ROUTINE W REFLEX MICROSCOPIC - Abnormal; Notable for the  following components:      Result Value   Color, Urine YELLOW (*)    APPearance CLEAR (*)    Glucose, UA >=500 (*)    Hgb urine dipstick SMALL (*)    All other components within normal limits  URINE DRUG SCREEN, QUALITATIVE ([facility] ONLY) - Abnormal; Notable for the following components:   Cocaine Metabolite,Ur Lee POSITIVE (*)    Cannabinoid 50 Ng, Ur  POSITIVE (*)    All other components within normal limits  BASIC METABOLIC PANEL - Abnormal; Notable for the following components:   Glucose, Bld 135 (*)    BUN 21 (*)    Creatinine, Ser 1.78 (*)    GFR, Estimated 48 (*)    All other components within normal limits  CBC  HIV ANTIBODY (ROUTINE TESTING W REFLEX)  BASIC METABOLIC PANEL  CBC  CBG MONITORING, ED  TROPONIN I (HIGH SENSITIVITY)  TROPONIN I (HIGH SENSITIVITY)    EKG Sinus rhythm with a rate of 85 bpm.  Normal axis and intervals.  Nonspecific ST changes  with T wave inversions inferiorly, no STEMI.  Comparison from 2 months ago was of a similar morphology but these inversions and faint J-point depressions were not as severe than  RADIOLOGY CT head interpreted by me without evidence of acute intracranial pathology  Official radiology report(s): CT Head Wo Contrast  Result Date: 04/18/2023 CLINICAL DATA:  Altered mental status, possible seizure EXAM: CT HEAD WITHOUT CONTRAST TECHNIQUE: Contiguous axial images were obtained from the base of the skull through the vertex without intravenous contrast. RADIATION DOSE REDUCTION: This exam was performed according to the departmental dose-optimization program which includes automated exposure control, adjustment of the mA and/or kV according to patient size and/or use of iterative reconstruction technique. COMPARISON:  05/01/2021 FINDINGS: Brain: No evidence of acute infarction, hemorrhage, mass, mass effect, or midline shift. No hydrocephalus or extra-axial fluid collection. Vascular: No hyperdense vessel. Skull: Negative for fracture  or focal lesion. Sinuses/Orbits: Mucosal thickening in the ethmoid air cells. No acute finding in the orbits. Other: The mastoid air cells are well aerated. IMPRESSION: No acute intracranial process. Electronically Signed   By: Wiliam Ke M.D.   On: 04/18/2023 23:38    PROCEDURES and INTERVENTIONS:  .1-3 Lead EKG Interpretation  Performed by: Delton Prairie, MD Authorized by: Delton Prairie, MD     Interpretation: normal     ECG rate:  76   ECG rate assessment: normal     Rhythm: sinus rhythm     Ectopy: none     Conduction: normal     Medications  oxyCODONE-acetaminophen (PERCOCET/ROXICET) 5-325 MG per tablet 1 tablet (has no administration in time range)  tamsulosin (FLOMAX) capsule 0.4 mg (has no administration in time range)  sodium chloride flush (NS) 0.9 % injection 3 mL (3 mLs Intravenous Given 04/19/23 0446)  enoxaparin (LOVENOX) injection 55 mg (has no administration in time range)  0.9 %  sodium chloride infusion ( Intravenous New Bag/Given 04/19/23 0440)  acetaminophen (TYLENOL) tablet 650 mg (has no administration in time range)    Or  acetaminophen (TYLENOL) suppository 650 mg (has no administration in time range)  traZODone (DESYREL) tablet 25 mg (has no administration in time range)  magnesium hydroxide (MILK OF MAGNESIA) suspension 30 mL (has no administration in time range)  ondansetron (ZOFRAN) tablet 4 mg (has no administration in time range)    Or  ondansetron (ZOFRAN) injection 4 mg (has no administration in time range)  pneumococcal 20-valent conjugate vaccine (PREVNAR 20) injection 0.5 mL (has no administration in time range)  influenza vac split trivalent PF (FLULAVAL) injection 0.5 mL (has no administration in time range)     IMPRESSION / MDM / ASSESSMENT AND PLAN / ED COURSE  I reviewed the triage vital signs and the nursing notes.  Differential diagnosis includes, but is not limited to, cardiac dysrhythmia, seizure, stroke, vasovagal episode  {Patient  presents with symptoms of an acute illness or injury that is potentially life-threatening.  Patient with cardiac history presents after a syncopal episode requiring observation admission.  Looks well now without signs of neurologic deficits, trauma or seizure activity.  Reassuring vital signs and no dysrhythmias noted on the monitor.  Metabolic panel with CKD, normal CBC.  UA without infectious features and his 2 negative troponins.  UDS does have positive cocaine metabolites.  Consult medicine for admission  Clinical Course as of 04/19/23 0507  Mon Apr 19, 2023  0231 Consult with medicine who agrees to admit [DS]    Clinical Course User Index [DS] Delton Prairie, MD  FINAL CLINICAL IMPRESSION(S) / ED DIAGNOSES   Final diagnoses:  Syncope and collapse     Rx / DC Orders   ED Discharge Orders     None        Note:  This document was prepared using Dragon voice recognition software and may include unintentional dictation errors.   Delton Prairie, MD 04/19/23 [phone]

## 2023-04-19 NOTE — Assessment & Plan Note (Signed)
-   We will continue Flomax 

## 2023-04-19 NOTE — Assessment & Plan Note (Signed)
-  The patient will be admitted to an observation medically monitored bed. - Will check orthostatics q 12 hours. - Will obtain a bilateral carotid Doppler and 2D echo. - The patient will be gently hydrated with IV normal saline and monitored for arrhythmias. -Differential diagnoses would include neurally mediated syncope, cardiogenic, arrhythmias related,  orthostatic hypotension and less likely hypoglycemia.

## 2023-04-19 NOTE — Plan of Care (Signed)
  Problem: Cardiac: Goal: Will achieve and/or maintain adequate cardiac output Outcome: Progressing   Problem: Education: Goal: Knowledge of General Education information will improve Description: Including pain rating scale, medication(s)/side effects and non-pharmacologic comfort measures Outcome: Progressing   Problem: Health Behavior/Discharge Planning: Goal: Ability to manage health-related needs will improve Outcome: Progressing   Problem: Clinical Measurements: Goal: Ability to maintain clinical measurements within normal limits will improve Outcome: Progressing   Problem: Clinical Measurements: Goal: Will remain free from infection Outcome: Progressing   Problem: Clinical Measurements: Goal: Diagnostic test results will improve Outcome: Progressing   Problem: Clinical Measurements: Goal: Respiratory complications will improve Outcome: Progressing   Problem: Clinical Measurements: Goal: Cardiovascular complication will be avoided Outcome: Progressing

## 2023-04-19 NOTE — Assessment & Plan Note (Signed)
-   We will continue Entresto, Jardiance, Toprol-XL and Aldactone.

## 2023-04-19 NOTE — Progress Notes (Signed)
PHARMACIST - PHYSICIAN COMMUNICATION  CONCERNING:  Enoxaparin (Lovenox) for DVT Prophylaxis    RECOMMENDATION: Patient was prescribed enoxaprin 40mg  q24 hours for VTE prophylaxis.   Filed Weights   04/18/23 2305  Weight: 108.9 kg (240 lb)    Body mass index is 33.47 kg/m.  Estimated Creatinine Clearance: 67.1 mL/min (A) (by C-G formula based on SCr of 1.78 mg/dL (H)).   Based on [facility] policy patient is candidate for enoxaparin 0.5mg /kg TBW SQ every 24 hours based on BMI being >30.  DESCRIPTION: Pharmacy has adjusted enoxaparin dose per [facility] policy.  Patient is now receiving enoxaparin 0.5 mg/kg every 24 hours   Otelia Sergeant, PharmD, [facility] 04/19/2023 2:37 AM

## 2023-04-19 NOTE — Assessment & Plan Note (Signed)
-  Continue antihypertensive therapy ?

## 2023-04-19 NOTE — Plan of Care (Signed)
  Problem: Education: Goal: Knowledge of condition and prescribed therapy will improve 04/19/2023 2014 by Blanchard Mane, RN Outcome: Progressing 04/19/2023 2014 by Blanchard Mane, RN Outcome: Progressing   Problem: Cardiac: Goal: Will achieve and/or maintain adequate cardiac output 04/19/2023 2014 by Blanchard Mane, RN Outcome: Progressing 04/19/2023 2014 by Blanchard Mane, RN Outcome: Progressing   Problem: Education: Goal: Knowledge of General Education information will improve Description: Including pain rating scale, medication(s)/side effects and non-pharmacologic comfort measures 04/19/2023 2014 by Blanchard Mane, RN Outcome: Progressing 04/19/2023 2014 by Blanchard Mane, RN Outcome: Progressing   Problem: Health Behavior/Discharge Planning: Goal: Ability to manage health-related needs will improve 04/19/2023 2014 by Blanchard Mane, RN Outcome: Progressing 04/19/2023 2014 by Blanchard Mane, RN Outcome: Progressing   Problem: Clinical Measurements: Goal: Ability to maintain clinical measurements within normal limits will improve 04/19/2023 2014 by Blanchard Mane, RN Outcome: Progressing 04/19/2023 2014 by Blanchard Mane, RN Outcome: Progressing   Problem: Clinical Measurements: Goal: Will remain free from infection 04/19/2023 2014 by Blanchard Mane, RN Outcome: Progressing 04/19/2023 2014 by Blanchard Mane, RN Outcome: Progressing   Problem: Clinical Measurements: Goal: Diagnostic test results will improve 04/19/2023 2014 by Blanchard Mane, RN Outcome: Progressing 04/19/2023 2014 by Blanchard Mane, RN Outcome: Progressing

## 2023-04-19 NOTE — Progress Notes (Signed)
*  PRELIMINARY RESULTS* Echocardiogram 2D Echocardiogram has been performed.  Carolyne Fiscal 04/19/2023, 2:52 PM

## 2023-04-19 NOTE — H&P (Signed)
[facility]   PATIENT NAME: Andrew Walters    MR#:  [id number]  DATE OF BIRTH:  09/23/1980  DATE OF ADMISSION:  04/19/2023  PRIMARY CARE PHYSICIAN: Alvina Filbert, MD   Patient is coming from: Home  REQUESTING/REFERRING PHYSICIAN: Delton Prairie, MD  CHIEF COMPLAINT:   Chief Complaint  Patient presents with   Loss of Consciousness    HISTORY OF PRESENT ILLNESS:  Andrew Walters is a 42 y.o. African-American male with medical history significant for CHF, stage IIIa chronic kidney disease and hypertension, who presented to emergency room with acute onset of syncope while standing in the kitchen and assisting his wife cooking.  He admitted to hitting the right side of his head with minimal contusion without laceration.  No paresthesias or focal muscle weakness.  No headache or dizziness or blurred vision or diplopia.  No chest pain or palpitations.  No nausea or vomiting or abdominal pain.  He denied any cough or wheezing or hemoptysis.  Dysuria, oliguria or hematuria or flank pain.  No bleeding diathesis.  Patient has a history of nonischemic cardiomyopathy with EF of 25% in 2022 however his repeat echo last year had normalized EF with moderate LVH.  ED Course: When he came to the ER, BP was 157/114 with otherwise normal vital signs.  Labs revealed a creatinine 1.78 and a BN of 21 and CBC was within normal.  UA showed more than 500 glucose and urine drug screen was positive for cocaine and cannabinoids. EKG as reviewed by me : Right side of his head EKG showed no sinus rhythm with a rate of 85 with poor R wave progression and T wave inversion inferiorly Imaging: Noncontrast head CT scan revealed no acute intracranial normalities.  The patient will be admitted to an observation medical telemetry bed for further evaluation and management. PAST MEDICAL HISTORY:   Past Medical History:  Diagnosis Date   CHF (congestive heart failure) (HCC)    Hypertension     PAST SURGICAL HISTORY:   History reviewed. No pertinent surgical history. No previous surgeries. SOCIAL HISTORY:   Social History   Tobacco Use   Smoking status: Every Day    Types: Cigarettes   Smokeless tobacco: Never  Substance Use Topics   Alcohol use: No    FAMILY HISTORY:   Family History  Problem Relation Age of Onset   Sleep apnea Brother     DRUG ALLERGIES:  No Known Allergies  REVIEW OF SYSTEMS:   ROS As per history of present illness. All pertinent systems were reviewed above. Constitutional, HEENT, cardiovascular, respiratory, GI, GU, musculoskeletal, neuro, psychiatric, endocrine, integumentary and hematologic systems were reviewed and are otherwise negative/unremarkable except for positive findings mentioned above in the HPI.   MEDICATIONS AT HOME:   Prior to Admission medications   Medication Sig Start Date End Date Taking? Authorizing Provider  atorvastatin (LIPITOR) 20 MG tablet Take 20 mg by mouth daily. Patient not taking: Reported on 02/15/2023    [provider]  empagliflozin (JARDIANCE) 10 MG TABS tablet Take 1 tablet (10 mg total) by mouth daily. Patient not taking: Reported on 02/15/2023 02/15/23     metoprolol succinate (TOPROL-XL) 100 MG 24 hr tablet Take 1 tablet (100 mg total) by mouth daily. Patient not taking: Reported on 02/15/2023 02/15/23     oxyCODONE-acetaminophen (PERCOCET) 5-325 MG tablet Take 1 tablet by mouth every 4 (four) hours as needed for severe pain. 04/11/23 04/10/24  Cruz Condon A, PA-C  potassium chloride  SA (KLOR-CON M) 20 MEQ tablet Take 2 tablets (40 mEq total) by mouth 2 (two) times daily. Patient not taking: Reported on 02/15/2023 10/27/22   Delma Freeze, FNP  sacubitril-valsartan (ENTRESTO) 49-51 MG Take 1 tablet by mouth 2 (two) times daily. Patient not taking: Reported on 02/15/2023 02/15/23     spironolactone (ALDACTONE) 25 MG tablet Take 1 tablet (25 mg total) by mouth daily. Patient not taking: Reported on 02/15/2023 02/15/23      tamsulosin (FLOMAX) 0.4 MG CAPS capsule Take 1 capsule (0.4 mg total) by mouth daily. 04/11/23 05/11/23  Cameron Ali, PA-C  torsemide (DEMADEX) 20 MG tablet Take 2 tablets (40 mg total) by mouth 2 (two) times daily. Patient not taking: Reported on 02/15/2023 10/27/22   Delma Freeze, FNP      VITAL SIGNS:  Blood pressure 119/76, pulse 80, temperature 97.6 F (36.4 C), temperature source Oral, resp. rate 20, height 5\' 11"  (1.803 m), weight 108.8 kg, SpO2 93%.  PHYSICAL EXAMINATION:  Physical Exam  GENERAL:  42 y.o.-year-old African-American male patient lying in the bed with no acute distress.  EYES: Pupils equal, round, reactive to light and accommodation. No scleral icterus. Extraocular muscles intact.  HEENT: Head atraumatic, normocephalic. Oropharynx and nasopharynx clear.  NECK:  Supple, no jugular venous distention. No thyroid enlargement, no tenderness.  LUNGS: Normal breath sounds bilaterally, no wheezing, rales,rhonchi or crepitation. No use of accessory muscles of respiration.  CARDIOVASCULAR: Regular rate and rhythm, S1, S2 normal. No murmurs, rubs, or gallops.  ABDOMEN: Soft, nondistended, nontender. Bowel sounds present. No organomegaly or mass.  EXTREMITIES: No pedal edema, cyanosis, or clubbing.  NEUROLOGIC: Cranial nerves II through XII are intact. Muscle strength 5/5 in all extremities. Sensation intact. Gait not checked.  PSYCHIATRIC: The patient is alert and oriented x 3.  Normal affect and good eye contact. SKIN: No obvious rash, lesion, or ulcer.   LABORATORY PANEL:   CBC Recent Labs  Lab 04/19/23 0504  WBC 7.8  HGB 16.9  HCT 48.0  PLT 312   ------------------------------------------------------------------------------------------------------------------  Chemistries  Recent Labs  Lab 04/19/23 0504  NA 139  K 3.7  CL 102  CO2 26  GLUCOSE 125*  BUN 20  CREATININE 1.64*  CALCIUM 9.3    ------------------------------------------------------------------------------------------------------------------  Cardiac Enzymes No results for input(s): "TROPONINI" in the last 168 hours. ------------------------------------------------------------------------------------------------------------------  RADIOLOGY:  CT Head Wo Contrast  Result Date: 04/18/2023 CLINICAL DATA:  Altered mental status, possible seizure EXAM: CT HEAD WITHOUT CONTRAST TECHNIQUE: Contiguous axial images were obtained from the base of the skull through the vertex without intravenous contrast. RADIATION DOSE REDUCTION: This exam was performed according to the departmental dose-optimization program which includes automated exposure control, adjustment of the mA and/or kV according to patient size and/or use of iterative reconstruction technique. COMPARISON:  05/01/2021 FINDINGS: Brain: No evidence of acute infarction, hemorrhage, mass, mass effect, or midline shift. No hydrocephalus or extra-axial fluid collection. Vascular: No hyperdense vessel. Skull: Negative for fracture or focal lesion. Sinuses/Orbits: Mucosal thickening in the ethmoid air cells. No acute finding in the orbits. Other: The mastoid air cells are well aerated. IMPRESSION: No acute intracranial process. Electronically Signed   By: Wiliam Ke M.D.   On: 04/18/2023 23:38      IMPRESSION AND PLAN:  Assessment and Plan: * Syncope - The patient will be admitted to an observation medically monitored bed. - Will check orthostatics q 12 hours. - Will obtain a bilateral carotid Doppler and 2D echo. - The  patient will be gently hydrated with IV normal saline and monitored for arrhythmias. -Differential diagnoses would include neurally mediated syncope, cardiogenic, arrhythmias related,  orthostatic hypotension and less likely hypoglycemia.    Polysubstance abuse (HCC) - This is including tobacco use, cocaine abuse and marijuana abuse. - He was  counseled for cessation and will receive further counseling here.  Essential hypertension - Continue antihypertensive therapy.  Dyslipidemia - We will continue statin therapy.  Nonischemic cardiomyopathy (HCC) - We will continue Entresto, Jardiance, Toprol-XL and Aldactone.  BPH (benign prostatic hyperplasia) --We will continue Flomax.   DVT prophylaxis: Lovenox. Advanced Care Planning:  Code Status: full code. Family Communication:  The plan of care was discussed in details with the patient (and family). I answered all questions. The patient agreed to proceed with the above mentioned plan. Further management will depend upon hospital course. Disposition Plan: Back to previous home environment Consults called: none. All the records are reviewed and case discussed with ED provider.  Status is: Observation  I certify that at the time of admission, it is my clinical judgment that the patient will require hospital care extending less than 2 midnights.                            Dispo: The patient is from: Home              Anticipated d/c is to: Home              Patient currently is not medically stable to d/c.              Difficult to place patient: No  Hannah Beat M.D on 04/19/2023 at 5:52 AM  [facility]   From 7 PM-7 AM, contact night-coverage [email or web address]  CC: Primary care physician; Alvina Filbert, MD

## 2023-04-19 NOTE — Assessment & Plan Note (Signed)
-   This is including tobacco use, cocaine abuse and marijuana abuse. - He was counseled for cessation and will receive further counseling here.

## 2023-04-19 NOTE — Progress Notes (Signed)
Interim progress note not for billing.  I have reviewed the chart, seen and examined the patient, and agree with assessment and plan completed by my colleague earlier this morning. Hx ongoing cocaine abuse, HFrecoveredEF, syncope, presenting with a syncopal event while standing making dinner yesterday, with prodrome. Orthostats here negative. Labs unremarkable. Denies recent med changes save for up-titration of potassium. Last saw cardiology one month ago. No events on tele overnight. W/u ordered includes carotid dopplers and tte. Cardiology noted worsening lvh on last tte so will f/u results of this tte, involve our cardiology team as needed. If no new problems anticipate discharge tomorrow with plan for close outpt [facility] cardiology f/u, consideration of cardiac monitor.

## 2023-04-19 NOTE — Assessment & Plan Note (Signed)
-   We will continue statin therapy. 

## 2023-04-20 DIAGNOSIS — R55 Syncope and collapse: Secondary | ICD-10-CM | POA: Diagnosis not present

## 2023-04-20 LAB — MAGNESIUM: Magnesium: 2.8 mg/dL — ABNORMAL HIGH (ref 1.7–2.4)

## 2023-04-20 NOTE — TOC CM/SW Note (Signed)
Transition of Care [facility]) - Inpatient Brief Assessment   Patient Details  Name: Andrew Walters MRN: [id number] Date of Birth: May 07, 1981  Transition of Care [facility]) CM/SW Contact:    Chapman Fitch, RN Phone Number: 04/20/2023, 9:47 AM   Clinical Narrative:    Transition of Care [facility]) Screening Note   Patient Details  Name: Andrew Walters Date of Birth: [birth year]   Transition of Care [facility]) CM/SW Contact:    Chapman Fitch, RN Phone Number: 04/20/2023, 9:47 AM    Transition of Care Department [facility]) has reviewed patient and no TOC needs have been identified at this time. We will continue to monitor patient advancement through interdisciplinary progression rounds. If new patient transition needs arise, please place a TOC consult.   Transition of Care Asessment: Insurance and Status: Insurance coverage has been reviewed Patient has primary care physician: Yes     Prior/Current Home Services: No current home services Social Determinants of Health Reivew: SDOH reviewed no interventions necessary Readmission risk has been reviewed: Yes Transition of care needs: no transition of care needs at this time

## 2023-04-20 NOTE — TOC Transition Note (Signed)
Transition of Care [facility]) - CM/SW Discharge Note   Patient Details  Name: Andrew Walters MRN: [id number] Date of Birth: 1981/04/08  Transition of Care [facility]) CM/SW Contact:  Chapman Fitch, RN Phone Number: 04/20/2023, 10:03 AM   Clinical Narrative:        Completed taxi voucher printed to unit.  Bedside RN to get signature on [employer] and place on patients chart     Patient Goals and CMS Choice      Discharge Placement                         Discharge Plan and Services Additional resources added to the After Visit Summary for                                       Social Determinants of Health (SDOH) Interventions SDOH Screenings   Food Insecurity: Patient Declined (04/19/2023)  Housing: Patient Declined (04/19/2023)  Transportation Needs: No Transportation Needs (04/19/2023)  Utilities: Patient Declined (04/19/2023)  Depression (PHQ2-9): Low Risk  (09/18/2021)  Financial Resource Strain: High Risk (01/22/2022)  Tobacco Use: High Risk (04/18/2023)     Readmission Risk Interventions     No data to display

## 2023-04-20 NOTE — Plan of Care (Signed)
  Problem: Cardiac: Goal: Will achieve and/or maintain adequate cardiac output Outcome: Progressing   Problem: Education: Goal: Knowledge of General Education information will improve Description: Including pain rating scale, medication(s)/side effects and non-pharmacologic comfort measures Outcome: Progressing

## 2023-04-20 NOTE — Discharge Summary (Signed)
Andrew Walters ZOX:[id number] DOB: [birth year] DOA: 04/19/2023  PCP: Alvina Filbert, MD  Admit date: 04/19/2023 Discharge date: 04/20/2023  Time spent: 35 minutes  Recommendations for Outpatient Follow-up:  Pcp and cardiology f/u Close monitoring of potassium and kidney function     Discharge Diagnoses:  Principal Problem:   Syncope Active Problems:   Polysubstance abuse (HCC)   Essential hypertension   Dyslipidemia   BPH (benign prostatic hyperplasia)   Nonischemic cardiomyopathy (HCC)   Discharge Condition: stable  Diet recommendation: heart healthy  Filed Weights   04/18/23 2305 04/19/23 0424  Weight: 108.9 kg 108.8 kg    History of present illness:  From admission h and p Andrew Walters is a 42 y.o. African-American male with medical history significant for CHF, stage IIIa chronic kidney disease and hypertension, who presented to emergency room with acute onset of syncope while standing in the kitchen and assisting his wife cooking.  He admitted to hitting the right side of his head with minimal contusion without laceration.  No paresthesias or focal muscle weakness.  No headache or dizziness or blurred vision or diplopia.  No chest pain or palpitations.  No nausea or vomiting or abdominal pain.  He denied any cough or wheezing or hemoptysis.  Dysuria, oliguria or hematuria or flank pain.  No bleeding diathesis.  Patient has a history of nonischemic cardiomyopathy with EF of 25% in 2022 however his repeat echo last year had normalized EF with moderate LVH.   Hospital Course:  Patient presents with a syncopal event while standing. No trauma and ct head negative. EKG without significant rhythm disturbance and no events on tele monitoring. Carotid u/s negative for high-grade stenosis, TTE with normal (recovered) ef, moderate LVH without note of significant rise in wall thickness. Labs stable from baseline. Patient was recently started on percocet and flomax for groin pain and ct showing  a 2 mm nonobstructing right renal calculus so either of these meds could have contributed to syncope. His pain is resolved and this stone is unlikely to be the cause of that former pain so we have advised holding both those meds. Patient ambulated without difficulty and there was no recurrence of syncope/presyncope. Orthostatic vital signs negative. UDS remains positive for cocaine, so toxic ingestion may have caused/contributed. We did not adjust home cardiac meds though several can contribute to syncope, advised close cardiology f/u. Will need close monitoring of potassium.  Procedures: none   Consultations: none  Discharge Exam: Vitals:   04/20/23 0509 04/20/23 0817  BP: (!) 141/93 139/82  Pulse: 77 78  Resp: 18 14  Temp: 98 F (36.7 C) 97.6 F (36.4 C)  SpO2: 97% 98%    General: NAD Cardiovascular: RRR, soft systolic murmur Respiratory: CTAB Ext: warm, no edema  Discharge Instructions   Discharge Instructions     Diet - low sodium heart healthy   Complete by: As directed    Increase activity slowly   Complete by: As directed       Allergies as of 04/20/2023   No Known Allergies      Medication List     STOP taking these medications    tamsulosin 0.4 MG Caps capsule Commonly known as: FLOMAX   torsemide 20 MG tablet Commonly known as: DEMADEX       TAKE these medications    atorvastatin 40 MG tablet Commonly known as: LIPITOR Take 1 tablet by mouth daily.   Entresto 49-51 MG Generic drug: sacubitril-valsartan Take 1 tablet by mouth 2 (two)  Andrew Walters ZOX:[id number] DOB: [birth year] DOA: 04/19/2023  PCP: Alvina Filbert, MD  Admit date: 04/19/2023 Discharge date: 04/20/2023  Time spent: 35 minutes  Recommendations for Outpatient Follow-up:  Pcp and cardiology f/u Close monitoring of potassium and kidney function     Discharge Diagnoses:  Principal Problem:   Syncope Active Problems:   Polysubstance abuse (HCC)   Essential hypertension   Dyslipidemia   BPH (benign prostatic hyperplasia)   Nonischemic cardiomyopathy (HCC)   Discharge Condition: stable  Diet recommendation: heart healthy  Filed Weights   04/18/23 2305 04/19/23 0424  Weight: 108.9 kg 108.8 kg    History of present illness:  From admission h and p Andrew Walters is a 42 y.o. African-American male with medical history significant for CHF, stage IIIa chronic kidney disease and hypertension, who presented to emergency room with acute onset of syncope while standing in the kitchen and assisting his wife cooking.  He admitted to hitting the right side of his head with minimal contusion without laceration.  No paresthesias or focal muscle weakness.  No headache or dizziness or blurred vision or diplopia.  No chest pain or palpitations.  No nausea or vomiting or abdominal pain.  He denied any cough or wheezing or hemoptysis.  Dysuria, oliguria or hematuria or flank pain.  No bleeding diathesis.  Patient has a history of nonischemic cardiomyopathy with EF of 25% in 2022 however his repeat echo last year had normalized EF with moderate LVH.   Hospital Course:  Patient presents with a syncopal event while standing. No trauma and ct head negative. EKG without significant rhythm disturbance and no events on tele monitoring. Carotid u/s negative for high-grade stenosis, TTE with normal (recovered) ef, moderate LVH without note of significant rise in wall thickness. Labs stable from baseline. Patient was recently started on percocet and flomax for groin pain and ct showing  a 2 mm nonobstructing right renal calculus so either of these meds could have contributed to syncope. His pain is resolved and this stone is unlikely to be the cause of that former pain so we have advised holding both those meds. Patient ambulated without difficulty and there was no recurrence of syncope/presyncope. Orthostatic vital signs negative. UDS remains positive for cocaine, so toxic ingestion may have caused/contributed. We did not adjust home cardiac meds though several can contribute to syncope, advised close cardiology f/u. Will need close monitoring of potassium.  Procedures: none   Consultations: none  Discharge Exam: Vitals:   04/20/23 0509 04/20/23 0817  BP: (!) 141/93 139/82  Pulse: 77 78  Resp: 18 14  Temp: 98 F (36.7 C) 97.6 F (36.4 C)  SpO2: 97% 98%    General: NAD Cardiovascular: RRR, soft systolic murmur Respiratory: CTAB Ext: warm, no edema  Discharge Instructions   Discharge Instructions     Diet - low sodium heart healthy   Complete by: As directed    Increase activity slowly   Complete by: As directed       Allergies as of 04/20/2023   No Known Allergies      Medication List     STOP taking these medications    tamsulosin 0.4 MG Caps capsule Commonly known as: FLOMAX   torsemide 20 MG tablet Commonly known as: DEMADEX       TAKE these medications    atorvastatin 40 MG tablet Commonly known as: LIPITOR Take 1 tablet by mouth daily.   Entresto 49-51 MG Generic drug: sacubitril-valsartan Take 1 tablet by mouth 2 (two)  times daily.   Jardiance 10 MG Tabs tablet Generic drug: empagliflozin Take 1 tablet (10 mg total) by mouth daily.   metoprolol succinate 100 MG 24 hr tablet Commonly known as: TOPROL-XL Take 1 tablet (100 mg total) by mouth daily.   oxyCODONE-acetaminophen 5-325 MG tablet Commonly known as: Percocet Take 1 tablet by mouth every 4 (four) hours as needed for severe pain.   potassium chloride SA 20 MEQ  tablet Commonly known as: KLOR-CON M Take 2 tablets (40 mEq total) by mouth 2 (two) times daily.   spironolactone 25 MG tablet Commonly known as: ALDACTONE Take 1 tablet (25 mg total) by mouth daily.       No Known Allergies  Follow-up Information     Alvina Filbert, MD Follow up.   Specialty: Internal Medicine Contact information: [address] [place] [address] [phone]         Lazarus Salines, MD Follow up.   Specialty: Cardiology Contact information: [address] [place] [address] [phone]                  The results of significant diagnostics from this hospitalization (including imaging, microbiology, ancillary and laboratory) are listed below for reference.    Significant Diagnostic Studies: ECHOCARDIOGRAM COMPLETE  Result Date: 04/19/2023    ECHOCARDIOGRAM REPORT   Patient Name:   AUTREY FORTINO Date of Exam: 04/19/2023 Medical Rec #:  [id number]   Height:       71.0 in Accession #:    [id number]  Weight:       239.9 lb Date of Birth:  1980-10-31   BSA:          2.278 m Patient Age:    42 years    BP:           139/66 mmHg Patient Gender: M           HR:           83 bpm. Exam Location:  [facility] Procedure: 2D Echo, Cardiac Doppler and Color Doppler Indications:     Syncope  History:         Patient has prior history of Echocardiogram examinations, most                  recent 04/29/2021. CHF and Cardiomyopathy,                  Signs/Symptoms:Syncope and Chest Pain; Risk                  Factors:Hypertension and Dyslipidemia. Polysubstance abuse.  Sonographer:     Mikki Harbor Referring Phys:  [id number] JAN A MANSY Diagnosing Phys: Lorine Bears MD IMPRESSIONS  1. Left ventricular ejection fraction, by estimation, is 50 to 55%. The left ventricle has low normal function. The left ventricle has no regional wall motion abnormalities. There is moderate left ventricular hypertrophy. Left ventricular diastolic parameters are consistent with  Grade I diastolic dysfunction (impaired relaxation).  2. Right ventricular systolic function is normal. The right ventricular size is normal. Tricuspid regurgitation signal is inadequate for assessing PA pressure.  3. The mitral valve is normal in structure. Trivial mitral valve regurgitation. No evidence of mitral stenosis.  4. The aortic valve is normal in structure. Aortic valve regurgitation is mild. No aortic stenosis is present.  5. The inferior vena cava is normal in size with greater than 50% respiratory variability, suggesting right atrial pressure of 3 mmHg. Comparison(s): A prior study  Andrew Walters ZOX:[id number] DOB: [birth year] DOA: 04/19/2023  PCP: Alvina Filbert, MD  Admit date: 04/19/2023 Discharge date: 04/20/2023  Time spent: 35 minutes  Recommendations for Outpatient Follow-up:  Pcp and cardiology f/u Close monitoring of potassium and kidney function     Discharge Diagnoses:  Principal Problem:   Syncope Active Problems:   Polysubstance abuse (HCC)   Essential hypertension   Dyslipidemia   BPH (benign prostatic hyperplasia)   Nonischemic cardiomyopathy (HCC)   Discharge Condition: stable  Diet recommendation: heart healthy  Filed Weights   04/18/23 2305 04/19/23 0424  Weight: 108.9 kg 108.8 kg    History of present illness:  From admission h and p Andrew Walters is a 42 y.o. African-American male with medical history significant for CHF, stage IIIa chronic kidney disease and hypertension, who presented to emergency room with acute onset of syncope while standing in the kitchen and assisting his wife cooking.  He admitted to hitting the right side of his head with minimal contusion without laceration.  No paresthesias or focal muscle weakness.  No headache or dizziness or blurred vision or diplopia.  No chest pain or palpitations.  No nausea or vomiting or abdominal pain.  He denied any cough or wheezing or hemoptysis.  Dysuria, oliguria or hematuria or flank pain.  No bleeding diathesis.  Patient has a history of nonischemic cardiomyopathy with EF of 25% in 2022 however his repeat echo last year had normalized EF with moderate LVH.   Hospital Course:  Patient presents with a syncopal event while standing. No trauma and ct head negative. EKG without significant rhythm disturbance and no events on tele monitoring. Carotid u/s negative for high-grade stenosis, TTE with normal (recovered) ef, moderate LVH without note of significant rise in wall thickness. Labs stable from baseline. Patient was recently started on percocet and flomax for groin pain and ct showing  a 2 mm nonobstructing right renal calculus so either of these meds could have contributed to syncope. His pain is resolved and this stone is unlikely to be the cause of that former pain so we have advised holding both those meds. Patient ambulated without difficulty and there was no recurrence of syncope/presyncope. Orthostatic vital signs negative. UDS remains positive for cocaine, so toxic ingestion may have caused/contributed. We did not adjust home cardiac meds though several can contribute to syncope, advised close cardiology f/u. Will need close monitoring of potassium.  Procedures: none   Consultations: none  Discharge Exam: Vitals:   04/20/23 0509 04/20/23 0817  BP: (!) 141/93 139/82  Pulse: 77 78  Resp: 18 14  Temp: 98 F (36.7 C) 97.6 F (36.4 C)  SpO2: 97% 98%    General: NAD Cardiovascular: RRR, soft systolic murmur Respiratory: CTAB Ext: warm, no edema  Discharge Instructions   Discharge Instructions     Diet - low sodium heart healthy   Complete by: As directed    Increase activity slowly   Complete by: As directed       Allergies as of 04/20/2023   No Known Allergies      Medication List     STOP taking these medications    tamsulosin 0.4 MG Caps capsule Commonly known as: FLOMAX   torsemide 20 MG tablet Commonly known as: DEMADEX       TAKE these medications    atorvastatin 40 MG tablet Commonly known as: LIPITOR Take 1 tablet by mouth daily.   Entresto 49-51 MG Generic drug: sacubitril-valsartan Take 1 tablet by mouth 2 (two)  Andrew Walters ZOX:[id number] DOB: [birth year] DOA: 04/19/2023  PCP: Alvina Filbert, MD  Admit date: 04/19/2023 Discharge date: 04/20/2023  Time spent: 35 minutes  Recommendations for Outpatient Follow-up:  Pcp and cardiology f/u Close monitoring of potassium and kidney function     Discharge Diagnoses:  Principal Problem:   Syncope Active Problems:   Polysubstance abuse (HCC)   Essential hypertension   Dyslipidemia   BPH (benign prostatic hyperplasia)   Nonischemic cardiomyopathy (HCC)   Discharge Condition: stable  Diet recommendation: heart healthy  Filed Weights   04/18/23 2305 04/19/23 0424  Weight: 108.9 kg 108.8 kg    History of present illness:  From admission h and p Andrew Walters is a 42 y.o. African-American male with medical history significant for CHF, stage IIIa chronic kidney disease and hypertension, who presented to emergency room with acute onset of syncope while standing in the kitchen and assisting his wife cooking.  He admitted to hitting the right side of his head with minimal contusion without laceration.  No paresthesias or focal muscle weakness.  No headache or dizziness or blurred vision or diplopia.  No chest pain or palpitations.  No nausea or vomiting or abdominal pain.  He denied any cough or wheezing or hemoptysis.  Dysuria, oliguria or hematuria or flank pain.  No bleeding diathesis.  Patient has a history of nonischemic cardiomyopathy with EF of 25% in 2022 however his repeat echo last year had normalized EF with moderate LVH.   Hospital Course:  Patient presents with a syncopal event while standing. No trauma and ct head negative. EKG without significant rhythm disturbance and no events on tele monitoring. Carotid u/s negative for high-grade stenosis, TTE with normal (recovered) ef, moderate LVH without note of significant rise in wall thickness. Labs stable from baseline. Patient was recently started on percocet and flomax for groin pain and ct showing  a 2 mm nonobstructing right renal calculus so either of these meds could have contributed to syncope. His pain is resolved and this stone is unlikely to be the cause of that former pain so we have advised holding both those meds. Patient ambulated without difficulty and there was no recurrence of syncope/presyncope. Orthostatic vital signs negative. UDS remains positive for cocaine, so toxic ingestion may have caused/contributed. We did not adjust home cardiac meds though several can contribute to syncope, advised close cardiology f/u. Will need close monitoring of potassium.  Procedures: none   Consultations: none  Discharge Exam: Vitals:   04/20/23 0509 04/20/23 0817  BP: (!) 141/93 139/82  Pulse: 77 78  Resp: 18 14  Temp: 98 F (36.7 C) 97.6 F (36.4 C)  SpO2: 97% 98%    General: NAD Cardiovascular: RRR, soft systolic murmur Respiratory: CTAB Ext: warm, no edema  Discharge Instructions   Discharge Instructions     Diet - low sodium heart healthy   Complete by: As directed    Increase activity slowly   Complete by: As directed       Allergies as of 04/20/2023   No Known Allergies      Medication List     STOP taking these medications    tamsulosin 0.4 MG Caps capsule Commonly known as: FLOMAX   torsemide 20 MG tablet Commonly known as: DEMADEX       TAKE these medications    atorvastatin 40 MG tablet Commonly known as: LIPITOR Take 1 tablet by mouth daily.   Entresto 49-51 MG Generic drug: sacubitril-valsartan Take 1 tablet by mouth 2 (two)

## 2023-04-20 NOTE — Progress Notes (Addendum)
Discharge instructions reviewed with patient including followup visits and medication changes.  Understanding was verbalized and all questions were answered.  IV removed without complication; patient tolerated well.  Patient awaiting taxi.

## 2023-06-17 ENCOUNTER — Other Ambulatory Visit: Payer: Self-pay

## 2023-06-17 ENCOUNTER — Emergency Department: Payer: 59

## 2023-06-17 ENCOUNTER — Emergency Department
Admission: EM | Admit: 2023-06-17 | Discharge: 2023-06-18 | Disposition: A | Discharge status: Home / Self Care | Payer: 59 | Attending: Emergency Medicine | Admitting: Emergency Medicine

## 2023-06-17 DIAGNOSIS — N189 Chronic kidney disease, unspecified: Secondary | ICD-10-CM | POA: Diagnosis not present

## 2023-06-17 DIAGNOSIS — Z20822 Contact with and (suspected) exposure to covid-19: Secondary | ICD-10-CM | POA: Diagnosis not present

## 2023-06-17 DIAGNOSIS — R059 Cough, unspecified: Secondary | ICD-10-CM | POA: Insufficient documentation

## 2023-06-17 DIAGNOSIS — R519 Headache, unspecified: Secondary | ICD-10-CM | POA: Diagnosis not present

## 2023-06-17 DIAGNOSIS — R509 Fever, unspecified: Secondary | ICD-10-CM | POA: Diagnosis not present

## 2023-06-17 DIAGNOSIS — R0789 Other chest pain: Secondary | ICD-10-CM | POA: Diagnosis not present

## 2023-06-17 DIAGNOSIS — Z0389 Encounter for observation for other suspected diseases and conditions ruled out: Secondary | ICD-10-CM | POA: Diagnosis not present

## 2023-06-17 DIAGNOSIS — R5381 Other malaise: Secondary | ICD-10-CM | POA: Insufficient documentation

## 2023-06-17 DIAGNOSIS — R55 Syncope and collapse: Secondary | ICD-10-CM | POA: Insufficient documentation

## 2023-06-17 DIAGNOSIS — I130 Hypertensive heart and chronic kidney disease with heart failure and stage 1 through stage 4 chronic kidney disease, or unspecified chronic kidney disease: Secondary | ICD-10-CM | POA: Diagnosis not present

## 2023-06-17 DIAGNOSIS — R079 Chest pain, unspecified: Secondary | ICD-10-CM | POA: Diagnosis present

## 2023-06-17 DIAGNOSIS — I509 Heart failure, unspecified: Secondary | ICD-10-CM | POA: Insufficient documentation

## 2023-06-17 DIAGNOSIS — I517 Cardiomegaly: Secondary | ICD-10-CM | POA: Diagnosis not present

## 2023-06-17 DIAGNOSIS — R0602 Shortness of breath: Secondary | ICD-10-CM | POA: Diagnosis not present

## 2023-06-17 NOTE — ED Triage Notes (Addendum)
Pt arrived from home via [facility] c/o generalized body aches, fever, congestion, headaches, and sharp stabbing chest pain for a few days. Per pt wife pt had x 2 syncopal episodes today.  EMS vs b/p 216/124, p 88, t 98.6

## 2023-06-17 NOTE — ED Provider Notes (Signed)
[facility] Provider Note    Event Date/Time   First MD Initiated Contact with Patient 06/17/23 2328     (approximate)   History   Chest Pain and Loss of Consciousness   HPI  Andrew Walters is a 42 y.o. male who presents to the ED for evaluation of Chest Pain and Loss of Consciousness   I reviewed medical DC summary from 2 months ago.  History of CHF with recovered EF, CKD, HTN who was evaluated for syncope. Hx cocaine abuse.  Patient presents to the ED via EMS from home for evaluation of 2 days of fever, chest pain, shortness of breath, cough, generalized malaise.  Global headaches.  Per EMS, wife reported 2 syncopal episodes at home for the patient, but when asked about this patient reports "I do not know."   Physical Exam   Triage Vital Signs: ED Triage Vitals  Encounter Vitals Group     BP 06/17/23 2315 (!) 192/113     Systolic BP Percentile --      Diastolic BP Percentile --      Pulse Rate 06/17/23 2315 92     Resp 06/17/23 2315 18     Temp 06/17/23 2315 98.4 F (36.9 C)     Temp Source 06/17/23 2315 Oral     SpO2 06/17/23 2315 96 %     Weight --      Height --      Head Circumference --      Peak Flow --      Pain Score 06/17/23 2325 8     Pain Loc --      Pain Education --      Exclude from Growth Chart --     Most recent vital signs: Vitals:   06/18/23 0300 06/18/23 0334  BP: (!) 161/112   Pulse:    Resp: (!) 21   Temp:  98.1 F (36.7 C)  SpO2:      General: Awake, no distress.  CV:  Good peripheral perfusion.  Resp:  Normal effort.  Abd:  No distention.  MSK:  No deformity noted.  Reproducible chest discomfort. Neuro:  No focal deficits appreciated. Other:     ED Results / Procedures / Treatments   Labs (all labs ordered are listed, but only abnormal results are displayed) Labs Reviewed  COMPREHENSIVE METABOLIC PANEL - Abnormal; Notable for the following components:      Result Value   Potassium 3.4  (*)    Glucose, Bld 103 (*)    Creatinine, Ser 1.29 (*)    All other components within normal limits  BRAIN NATRIURETIC PEPTIDE - Abnormal; Notable for the following components:   B Natriuretic Peptide 275.0 (*)    All other components within normal limits  TROPONIN I (HIGH SENSITIVITY) - Abnormal; Notable for the following components:   Troponin I (High Sensitivity) 46 (*)    All other components within normal limits  TROPONIN I (HIGH SENSITIVITY) - Abnormal; Notable for the following components:   Troponin I (High Sensitivity) 41 (*)    All other components within normal limits  CULTURE, BLOOD (ROUTINE X 2)  CULTURE, BLOOD (ROUTINE X 2)  SARS CORONAVIRUS 2 BY RT PCR  LACTIC ACID, PLASMA  LACTIC ACID, PLASMA  CBC WITH DIFFERENTIAL/PLATELET  PROTIME-INR  APTT  PROCALCITONIN  URINALYSIS, W/ REFLEX TO CULTURE (INFECTION SUSPECTED)  URINE DRUG SCREEN, QUALITATIVE ([facility] ONLY)    EKG Sinus rhythm with a rate of 89 bpm.  Normal axis and intervals.  Nonspecific ST changes without STEMI.  RADIOLOGY CXR interpreted by me without evidence of acute cardiopulmonary pathology. CT head interpreted by me without evidence of acute intracranial pathology  Official radiology report(s): DG Chest Port 1 View  Result Date: 06/18/2023 CLINICAL DATA:  Possible sepsis EXAM: PORTABLE CHEST 1 VIEW COMPARISON:  08/02/2021 FINDINGS: Cardiac shadow is enlarged. Lungs are clear bilaterally. No bony abnormality is noted. IMPRESSION: No active disease. Electronically Signed   By: Alcide Clever M.D.   On: 06/18/2023 00:17   CT HEAD WO CONTRAST ( )  Result Date: 06/18/2023 CLINICAL DATA:  Recent syncopal episode EXAM: CT HEAD WITHOUT CONTRAST TECHNIQUE: Contiguous axial images were obtained from the base of the skull through the vertex without intravenous contrast. RADIATION DOSE REDUCTION: This exam was performed according to the departmental dose-optimization program which includes automated exposure  control, adjustment of the mA and/or kV according to patient size and/or use of iterative reconstruction technique. COMPARISON:  04/18/2023 FINDINGS: Brain: No evidence of acute infarction, hemorrhage, hydrocephalus, extra-axial collection or mass lesion/mass effect. Vascular: No hyperdense vessel or unexpected calcification. Skull: Normal. Negative for fracture or focal lesion. Sinuses/Orbits: Orbits and their contents are within normal limits. Stable left mastoid effusion is noted. Other: None. IMPRESSION: No acute intracranial abnormality noted. Electronically Signed   By: Alcide Clever M.D.   On: 06/18/2023 00:11    PROCEDURES and INTERVENTIONS:  .1-3 Lead EKG Interpretation  Performed by: Delton Prairie, MD Authorized by: Delton Prairie, MD     Interpretation: normal     ECG rate:  90   ECG rate assessment: normal     Rhythm: sinus rhythm     Ectopy: none     Conduction: normal     Medications  butalbital-acetaminophen-caffeine (FIORICET) 50-325-40 MG per tablet 2 tablet (2 tablets Oral Given 06/18/23 0145)  ketorolac (TORADOL) 30 MG/ML injection 15 mg (15 mg Intravenous Given 06/18/23 0145)     IMPRESSION / MDM / ASSESSMENT AND PLAN / ED COURSE  I reviewed the triage vital signs and the nursing notes.  Differential diagnosis includes, but is not limited to, ACS, PTX, PNA, muscle strain/spasm, PE, dissection, anxiety, pleural effusion  {Patient presents with symptoms of an acute illness or injury that is potentially life-threatening.  Patient presents with 2 days of reported fevers, cough and chest discomfort with a fairly benign workup.  EKG is nonischemic and troponins are downtrending and chronically mildly elevated.  Negative procalcitonin and normal WBC and I doubt sepsis or bacterial etiology of his symptoms.  Metabolic panel with CKD at baseline.  BNP is elevated but he denies orthopnea, he does not appear volume overloaded and his CXR is clear.  Awaiting UA and UDS at the time  of signout to oncoming morning physician.  Viral syndrome is a possibility considering his negative procalcitonin and lactic acid in the setting of reported fevers and malaise.  He is feeling better and pending this urinalysis, p.o. trial I suspect to be suitable for outpatient management.  Clinical Course as of 06/18/23 0649  Thu Jun 17, 2023  2327 Fever, cough, cp 2 days [DS]  Fri Jun 18, 2023  0145 reassessed [DS]  0308 Reassessed. Starting to feel better [DS]  0619 Reassessed. Feeling better.  [DS]    Clinical Course User Index [DS] Delton Prairie, MD     FINAL CLINICAL IMPRESSION(S) / ED DIAGNOSES   Final diagnoses:  Other chest pain     Rx / DC Orders  ED Discharge Orders     None        Note:  This document was prepared using Dragon voice recognition software and may include unintentional dictation errors.   Delton Prairie, MD 06/18/23 [phone]

## 2023-06-18 ENCOUNTER — Telehealth: Payer: Self-pay | Admitting: Emergency Medicine

## 2023-06-18 DIAGNOSIS — I517 Cardiomegaly: Secondary | ICD-10-CM | POA: Diagnosis not present

## 2023-06-18 DIAGNOSIS — Z0389 Encounter for observation for other suspected diseases and conditions ruled out: Secondary | ICD-10-CM | POA: Diagnosis not present

## 2023-06-18 DIAGNOSIS — R55 Syncope and collapse: Secondary | ICD-10-CM | POA: Diagnosis not present

## 2023-06-18 LAB — URINALYSIS, W/ REFLEX TO CULTURE (INFECTION SUSPECTED)
Bacteria, UA: NONE SEEN
Bilirubin Urine: NEGATIVE
Glucose, UA: NEGATIVE mg/dL
Hgb urine dipstick: NEGATIVE
Ketones, ur: NEGATIVE mg/dL
Leukocytes,Ua: NEGATIVE
Nitrite: NEGATIVE
Protein, ur: NEGATIVE mg/dL
Specific Gravity, Urine: 1.025 (ref 1.005–1.030)
pH: 5 (ref 5.0–8.0)

## 2023-06-18 LAB — CBC WITH DIFFERENTIAL/PLATELET
Abs Immature Granulocytes: 0.01 10*3/uL (ref 0.00–0.07)
Basophils Absolute: 0.1 10*3/uL (ref 0.0–0.1)
Basophils Relative: 1 %
Eosinophils Absolute: 0.5 10*3/uL (ref 0.0–0.5)
Eosinophils Relative: 6 %
HCT: 41.9 % (ref 39.0–52.0)
Hemoglobin: 14.3 g/dL (ref 13.0–17.0)
Immature Granulocytes: 0 %
Lymphocytes Relative: 18 %
Lymphs Abs: 1.3 10*3/uL (ref 0.7–4.0)
MCH: 32.6 pg (ref 26.0–34.0)
MCHC: 34.1 g/dL (ref 30.0–36.0)
MCV: 95.4 fL (ref 80.0–100.0)
Monocytes Absolute: 0.9 10*3/uL (ref 0.1–1.0)
Monocytes Relative: 12 %
Neutro Abs: 4.6 10*3/uL (ref 1.7–7.7)
Neutrophils Relative %: 63 %
Platelets: 263 10*3/uL (ref 150–400)
RBC: 4.39 MIL/uL (ref 4.22–5.81)
RDW: 14.3 % (ref 11.5–15.5)
WBC: 7.3 10*3/uL (ref 4.0–10.5)
nRBC: 0 % (ref 0.0–0.2)

## 2023-06-18 LAB — COMPREHENSIVE METABOLIC PANEL
ALT: 28 U/L (ref 0–44)
AST: 28 U/L (ref 15–41)
Albumin: 4 g/dL (ref 3.5–5.0)
Alkaline Phosphatase: 60 U/L (ref 38–126)
Anion gap: 8 (ref 5–15)
BUN: 11 mg/dL (ref 6–20)
CO2: 26 mmol/L (ref 22–32)
Calcium: 9.1 mg/dL (ref 8.9–10.3)
Chloride: 104 mmol/L (ref 98–111)
Creatinine, Ser: 1.29 mg/dL — ABNORMAL HIGH (ref 0.61–1.24)
GFR, Estimated: >60 mL/min (ref >60)
Glucose, Bld: 103 mg/dL — ABNORMAL HIGH (ref 70–99)
Potassium: 3.4 mmol/L — ABNORMAL LOW (ref 3.5–5.1)
Sodium: 138 mmol/L (ref 135–145)
Total Bilirubin: 0.6 mg/dL (ref <1.2)
Total Protein: 7.4 g/dL (ref 6.5–8.1)

## 2023-06-18 LAB — URINE DRUG SCREEN, QUALITATIVE (ARMC ONLY)
Amphetamines, Ur Screen: NOT DETECTED
Barbiturates, Ur Screen: POSITIVE — AB
Benzodiazepine, Ur Scrn: NOT DETECTED
Cannabinoid 50 Ng, Ur ~~LOC~~: POSITIVE — AB
Cocaine Metabolite,Ur ~~LOC~~: POSITIVE — AB
MDMA (Ecstasy)Ur Screen: NOT DETECTED
Methadone Scn, Ur: NOT DETECTED
Opiate, Ur Screen: NOT DETECTED
Phencyclidine (PCP) Ur S: NOT DETECTED
Tricyclic, Ur Screen: NOT DETECTED

## 2023-06-18 LAB — PROCALCITONIN: Procalcitonin: <0.1 ng/mL

## 2023-06-18 LAB — PROTIME-INR
INR: 1 (ref 0.8–1.2)
Prothrombin Time: 13.3 s (ref 11.4–15.2)

## 2023-06-18 LAB — TROPONIN I (HIGH SENSITIVITY)
Troponin I (High Sensitivity): 46 ng/L — ABNORMAL HIGH (ref <18)
Troponin I (High Sensitivity): 41 ng/L — ABNORMAL HIGH (ref <18)

## 2023-06-18 LAB — APTT: aPTT: 35 s (ref 24–36)

## 2023-06-18 LAB — SARS CORONAVIRUS 2 BY RT PCR: SARS Coronavirus 2 by RT PCR: NEGATIVE

## 2023-06-18 LAB — LACTIC ACID, PLASMA
Lactic Acid, Venous: 1 mmol/L (ref 0.5–1.9)
Lactic Acid, Venous: 0.9 mmol/L (ref 0.5–1.9)

## 2023-06-18 LAB — BRAIN NATRIURETIC PEPTIDE: B Natriuretic Peptide: 275 pg/mL — ABNORMAL HIGH (ref 0.0–100.0)

## 2023-06-18 MED ORDER — BUTALBITAL-APAP-CAFFEINE 50-325-40 MG PO TABS
2.0000 | ORAL_TABLET | Freq: Once | ORAL | Status: AC
  Administered 2023-06-18: 2 via ORAL
  Filled 2023-06-18: qty 2

## 2023-06-18 MED ORDER — KETOROLAC TROMETHAMINE 30 MG/ML IJ SOLN
15.0000 mg | Freq: Once | INTRAMUSCULAR | Status: AC
Start: 2023-06-18 — End: 2023-06-18
  Administered 2023-06-18: 15 mg via INTRAVENOUS
  Filled 2023-06-18: qty 1

## 2023-06-18 NOTE — Telephone Encounter (Signed)
Attempted to call patient regarding + BC bottle. No answer at this time. Spoke with EDP Derrill Kay regarding +BC, likely contaminate, if patient feeling unwell, return to ED.

## 2023-06-18 NOTE — ED Notes (Signed)
This nurse gave pt phone to call family for a ride home

## 2023-06-19 LAB — BLOOD CULTURE ID PANEL (REFLEXED) - BCID2

## 2023-06-21 LAB — CULTURE, BLOOD (ROUTINE X 2): Special Requests: ADEQUATE

## 2023-06-21 NOTE — Telephone Encounter (Signed)
Called patient regarding Bld Culture and ask about condition.  No answer and no VM.

## 2023-06-23 LAB — CULTURE, BLOOD (ROUTINE X 2)
Culture: NO GROWTH
Special Requests: ADEQUATE

## 2023-06-28 ENCOUNTER — Telehealth: Payer: Self-pay | Admitting: Family

## 2023-06-28 NOTE — Telephone Encounter (Signed)
Called pt to confirm phone out of service

## 2023-06-29 ENCOUNTER — Encounter: Payer: 59 | Admitting: Family

## 2023-06-29 NOTE — Progress Notes (Unsigned)
PCP: [facility] (last seen 06/24) Primary Cardiologist: Willene Hatchet, MD (last seen 08/24)  HPI:  [name] is a 42 y/o male with a history of HTN, depression, current tobacco use and chronic heart failure.   Was in the ED 04/11/23 due to LLQ abdominal pain with radiation into his groin for one week.   Echo 04/29/21: EF of 25-30% along with mild LVH, severe LAE and mild MR.  Echo 05/18/22: EF 55% along with mild AR.     He presents today for a HF follow-up visit with a chief complaint of     ROS: All systems negative except as listed in HPI, PMH and Problem List.  SH:  Social History   Socioeconomic History   Marital status: Single    Spouse name: Not on file   Number of children: Not on file   Years of education: Not on file   Highest education level: Not on file  Occupational History   Not on file  Tobacco Use   Smoking status: Every Day    Types: Cigarettes   Smokeless tobacco: Never  Vaping Use   Vaping status: Never Used  Substance and Sexual Activity   Alcohol use: No   Drug use: Not Currently    Types: Marijuana   Sexual activity: Not on file  Other Topics Concern   Not on file  Social History Narrative   Not on file   Social Determinants of Health   Financial Resource Strain: High Risk (01/22/2022)   Overall Financial Resource Strain (CARDIA)    Difficulty of Paying Living Expenses: Very hard  Food Insecurity: Patient Declined (04/19/2023)   Hunger Vital Sign    Worried About Running Out of Food in the Last Year: Patient declined    Ran Out of Food in the Last Year: Patient declined  Transportation Needs: No Transportation Needs (04/19/2023)   PRAPARE - Administrator, Civil Service (Medical): No    Lack of Transportation (Non-Medical): No  Physical Activity: Not on file  Stress: Not on file  Social Connections: Not on file  Intimate Partner Violence: Patient Declined (04/19/2023)   Humiliation, Afraid, Rape, and Kick  questionnaire    Fear of Current or Ex-Partner: Patient declined    Emotionally Abused: Patient declined    Physically Abused: Patient declined    Sexually Abused: Patient declined    FH:  Family History  Problem Relation Age of Onset   Sleep apnea Brother     Past Medical History:  Diagnosis Date   CHF (congestive heart failure) (HCC)    Hypertension     Current Outpatient Medications  Medication Sig Dispense Refill   atorvastatin (LIPITOR) 40 MG tablet Take 1 tablet by mouth daily.     empagliflozin (JARDIANCE) 10 MG TABS tablet Take 1 tablet (10 mg total) by mouth daily. 90 tablet 3   metoprolol succinate (TOPROL-XL) 100 MG 24 hr tablet Take 1 tablet (100 mg total) by mouth daily. 90 tablet 3   potassium chloride SA (KLOR-CON M) 20 MEQ tablet Take 2 tablets (40 mEq total) by mouth 2 (two) times daily. 120 tablet 5   sacubitril-valsartan (ENTRESTO) 49-51 MG Take 1 tablet by mouth 2 (two) times daily. 180 tablet 3   spironolactone (ALDACTONE) 25 MG tablet Take 1 tablet (25 mg total) by mouth daily. 90 tablet 3   No current facility-administered medications for this visit.     PHYSICAL EXAM:  General:  Well appearing. No resp  difficulty HEENT: normal Neck: supple. JVP flat. No lymphadenopathy or thryomegaly appreciated. Cor: PMI normal. Regular rate & rhythm. No rubs, gallops or murmurs. Lungs: clear Abdomen: soft, nontender, nondistended. No hepatosplenomegaly. No bruits or masses.  Extremities: no cyanosis, clubbing, rash, edema Neuro: alert & oriented x3, cranial nerves grossly intact. Moves all 4 extremities w/o difficulty. Affect pleasant.   ECG:    ASSESSMENT & PLAN:  1: NICM with preserved ejection fraction- - suspect due to HTN/ untreated OSA - NYHA class III - euvolemic - not weighing daily; encouraged to resume and to call for an overnight weight gain of > 2 pounds or a weekly weight gain of >5 pounds - weight up 2 pounds from last visit here 3 months  ago - Echo 04/29/21: EF of 25-30% along with mild LVH, severe LAE and mild MR.  - Echo 05/18/22: EF 55% along with mild AR.  - resume torsemide 40mg  BID - resume potassium BID - resume entresto 49/51mg  BID  - resume jardiance 10mg  daily  - resume metoprolol succinate 100mg  daily - resume spironolactone 25mg  daily - RN called [employer] and has all the RX from his other pharmacy so he can go pick them up today; samples provided of entresto and jardiance and 1 dose of entresto was given while he was in the office - unclear how much fluid he drinks and he can't quantify it; "stays thirsty" - saw HF cardiology Jaymes Graff) 08/24 - BNP 10/26/22 was 28.7  2: HTN- - BP  - has been off all meds for ~ 2 months but will be resuming them all today; 1 entresto given in office and he was instructed to take his 2nd dose of entresto later today along with all his other meds once he picks them up - saw PCP @ [facility] virtually on 06/25 - BMP 10/29/22 showed sodium 139, potassium 3.7, creatinine 1.83 and GFR 47   3: Tobacco use- - denies tobacco for ~ several months - denies alcohol/drug use  4: Depression/ stress- - endorses a lot of stress as he deals with the health of the mother of his 2 young kids - emotional support given  5: Snoring- - saw pulmonology Craige Cotta) 01/24 & was supposed to get a home sleep study set up - will check at next visit to see if his insurance will pay for a home sleep study  Return in 2 weeks, sooner if needed.

## 2023-06-29 NOTE — Telephone Encounter (Signed)
Patient did not show for his Heart Failure Clinic appointment on 06/29/23.

## 2023-08-09 IMAGING — CT CT HEAD W/O CM
3 series · 16 of 47 positions shown, 19 images · non-contrast
Comparison: Head CT from 11/22/2019.

CLINICAL DATA: Initial evaluation for acute headache.

EXAM:
CT HEAD WITHOUT CONTRAST
TECHNIQUE: Contiguous axial images were obtained from the base of the skull
through the vertex without intravenous contrast.

[Series 2: head wo · axial · 0.43mm/px · z∈[+28,+168]mm · 10 of 34 slices shown, 13 images]
[im 3/34  brain]
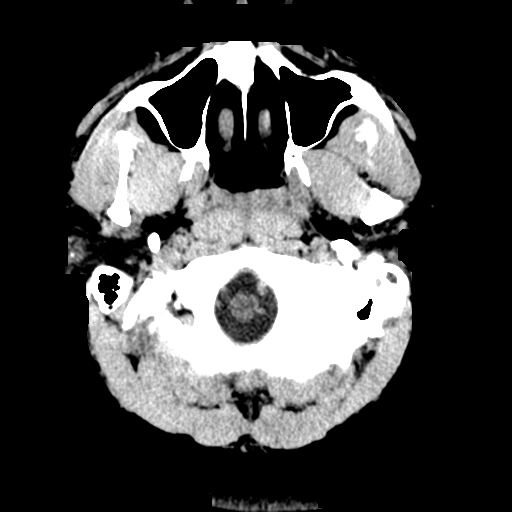
[im 3/34  bone]
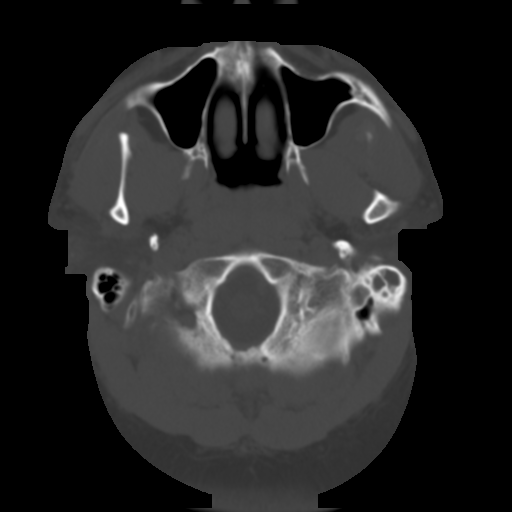
[im 6/34  brain]
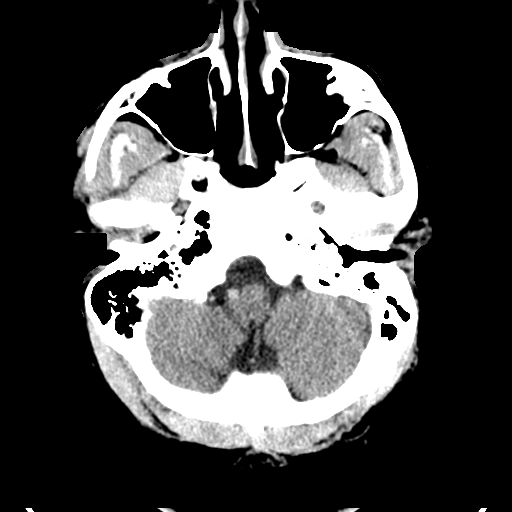
[im 10/34  brain]
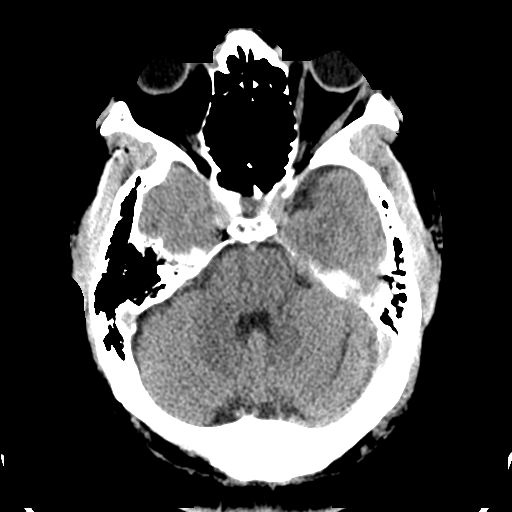
[im 12/34  brain]
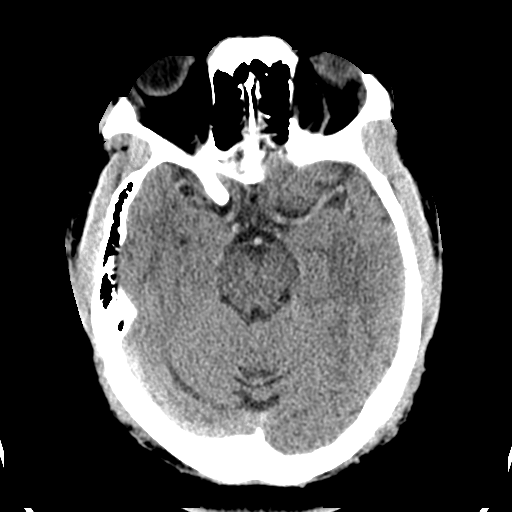
[im 15/34  brain]
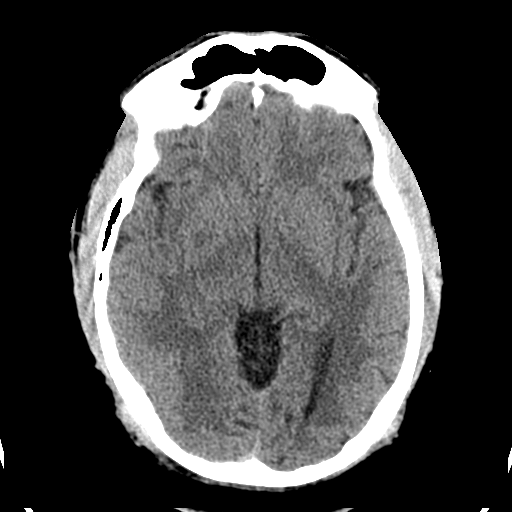
[im 15/34  bone]
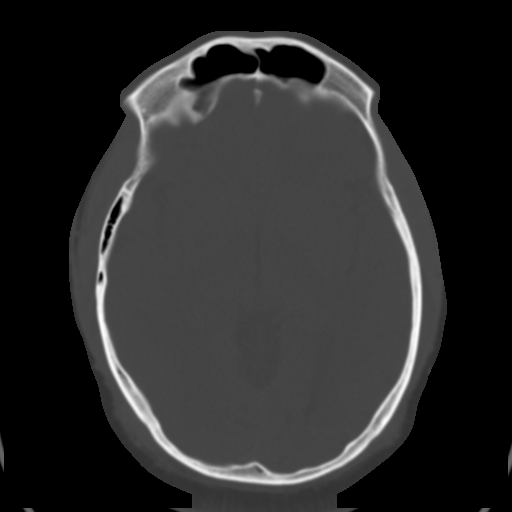
[im 19/34  brain]
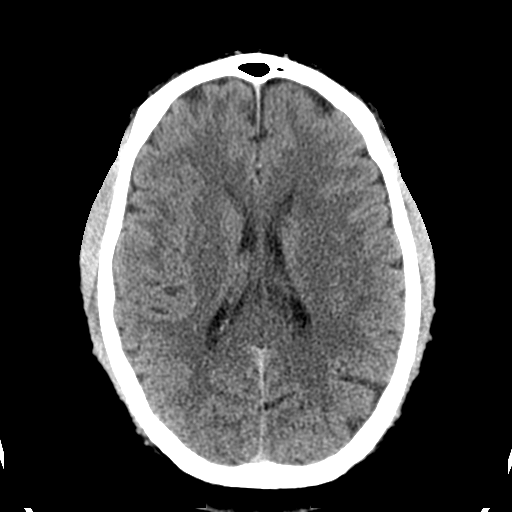
[im 22/34  brain]
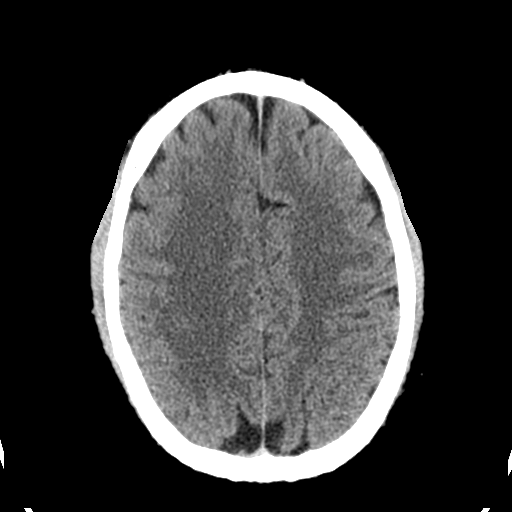
[im 26/34  brain]
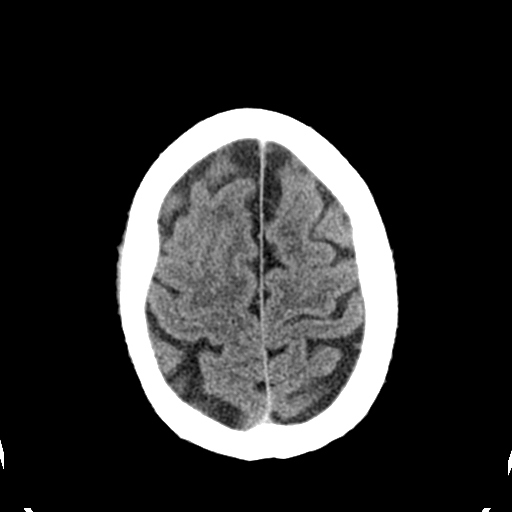
[im 28/34  brain]
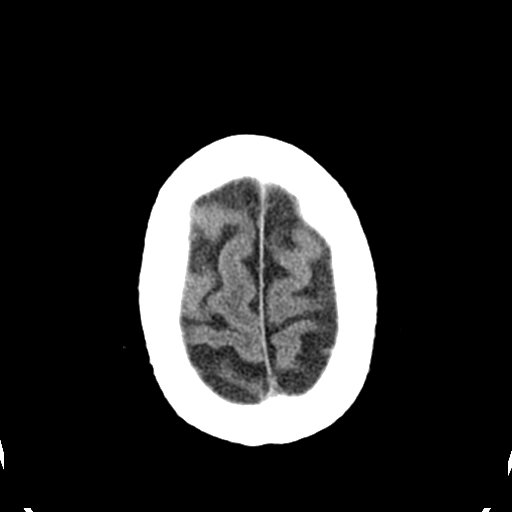
[im 28/34  bone]
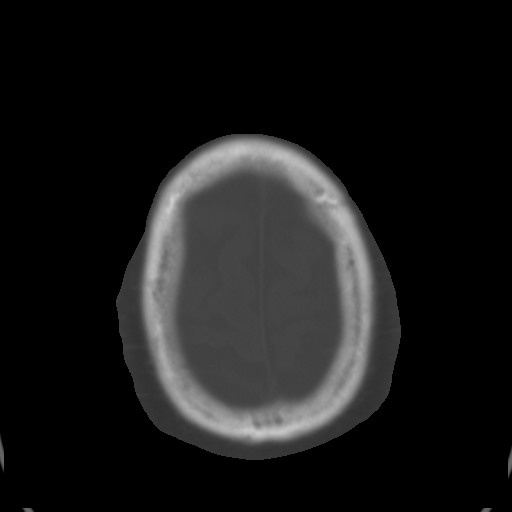
[im 31/34  brain]
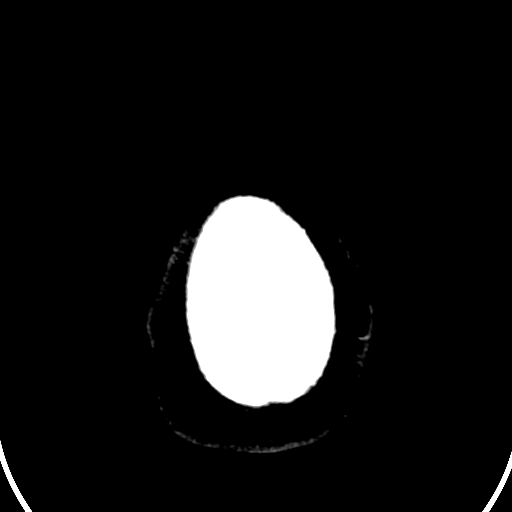

[Series 4: coronal soft tissue · coronal · 0.34mm/px · 3 of 75 slices shown]
[im 25/75  brain]
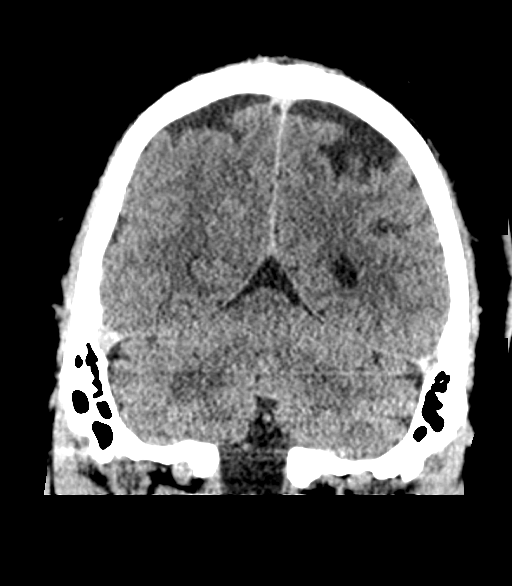
[im 33/75  brain]
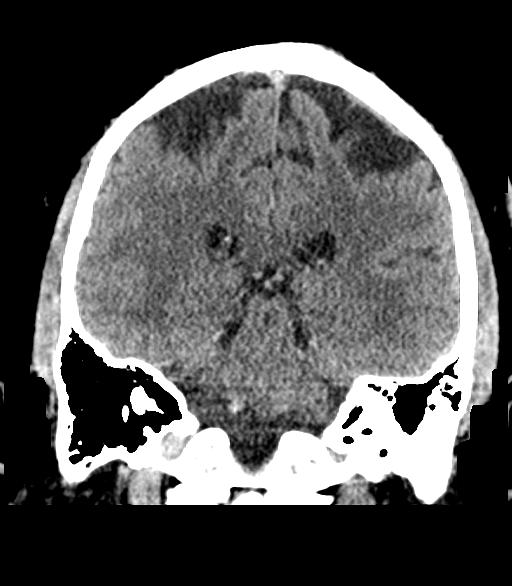
[im 42/75  brain]
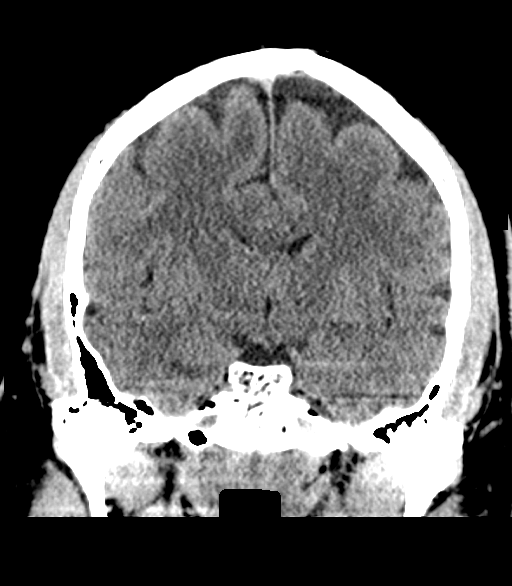

[Series 5: sagittal soft tissue · sagittal · 0.39mm/px · 3 of 60 slices shown]
[im 20/60  brain]
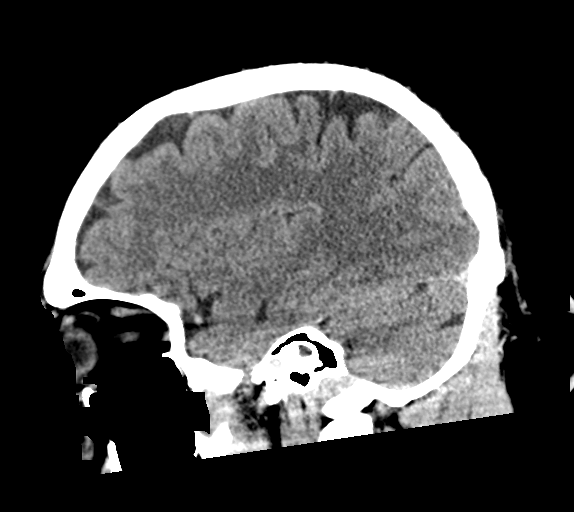
[im 30/60  brain]
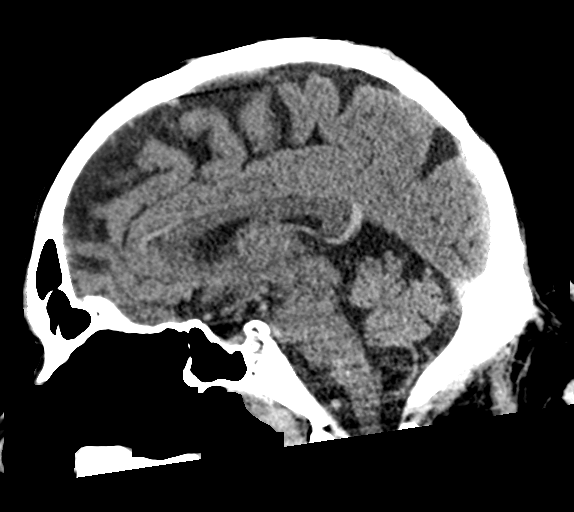
[im 40/60  brain]
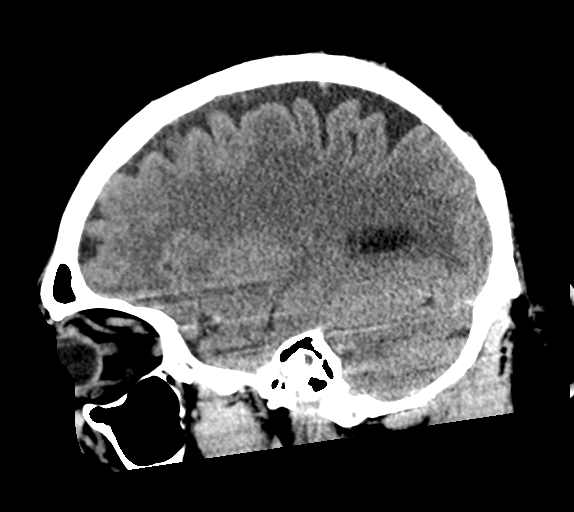

[16 of 47 positions shown; findings below may reference images not displayed]

FINDINGS: Brain: Cerebral volume within normal limits for age. No acute
intracranial hemorrhage. No acute large vessel territory infarct. No
mass lesion, midline shift or mass effect. No hydrocephalus or
extra-axial fluid collection.

Vascular: No hyperdense vessel.

Skull: Scalp soft tissues and calvarium within normal limits.

Sinuses/Orbits: Globes orbital soft tissues demonstrate no acute
finding. Paranasal sinuses are largely clear. Left mastoid effusion
noted. Right mastoid air cells clear.

Other: None.
IMPRESSION: 1. Negative head CT. No acute intracranial abnormality identified.
2. Left mastoid effusion.

## 2023-11-10 IMAGING — CR DG CHEST 2V
2 series · 2 of 2 positions shown · non-contrast
Comparison: 04/28/2021

CLINICAL DATA: Chest pain.  Short of breath.

EXAM:
CHEST - 2 VIEW

[chest pa]
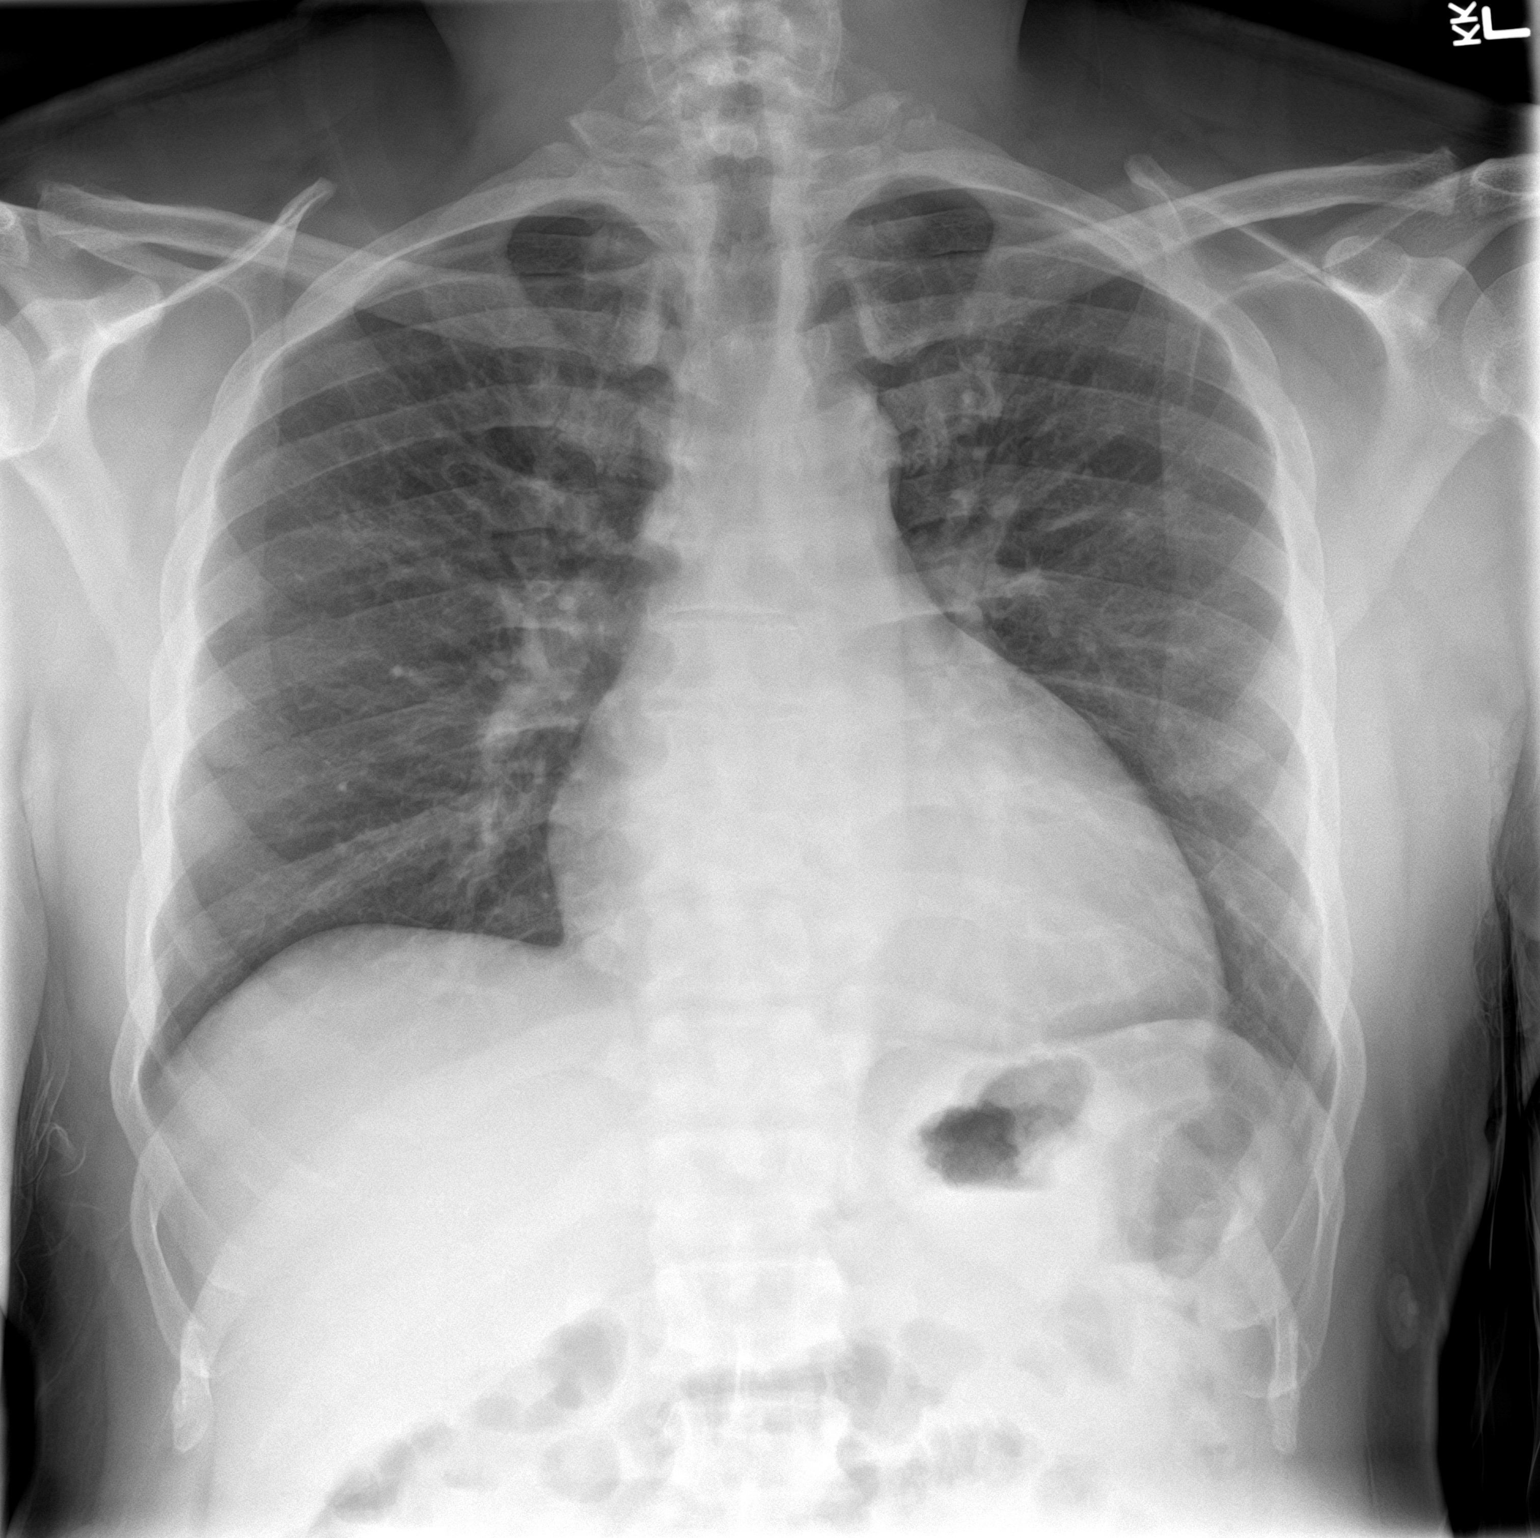

[chest lat]
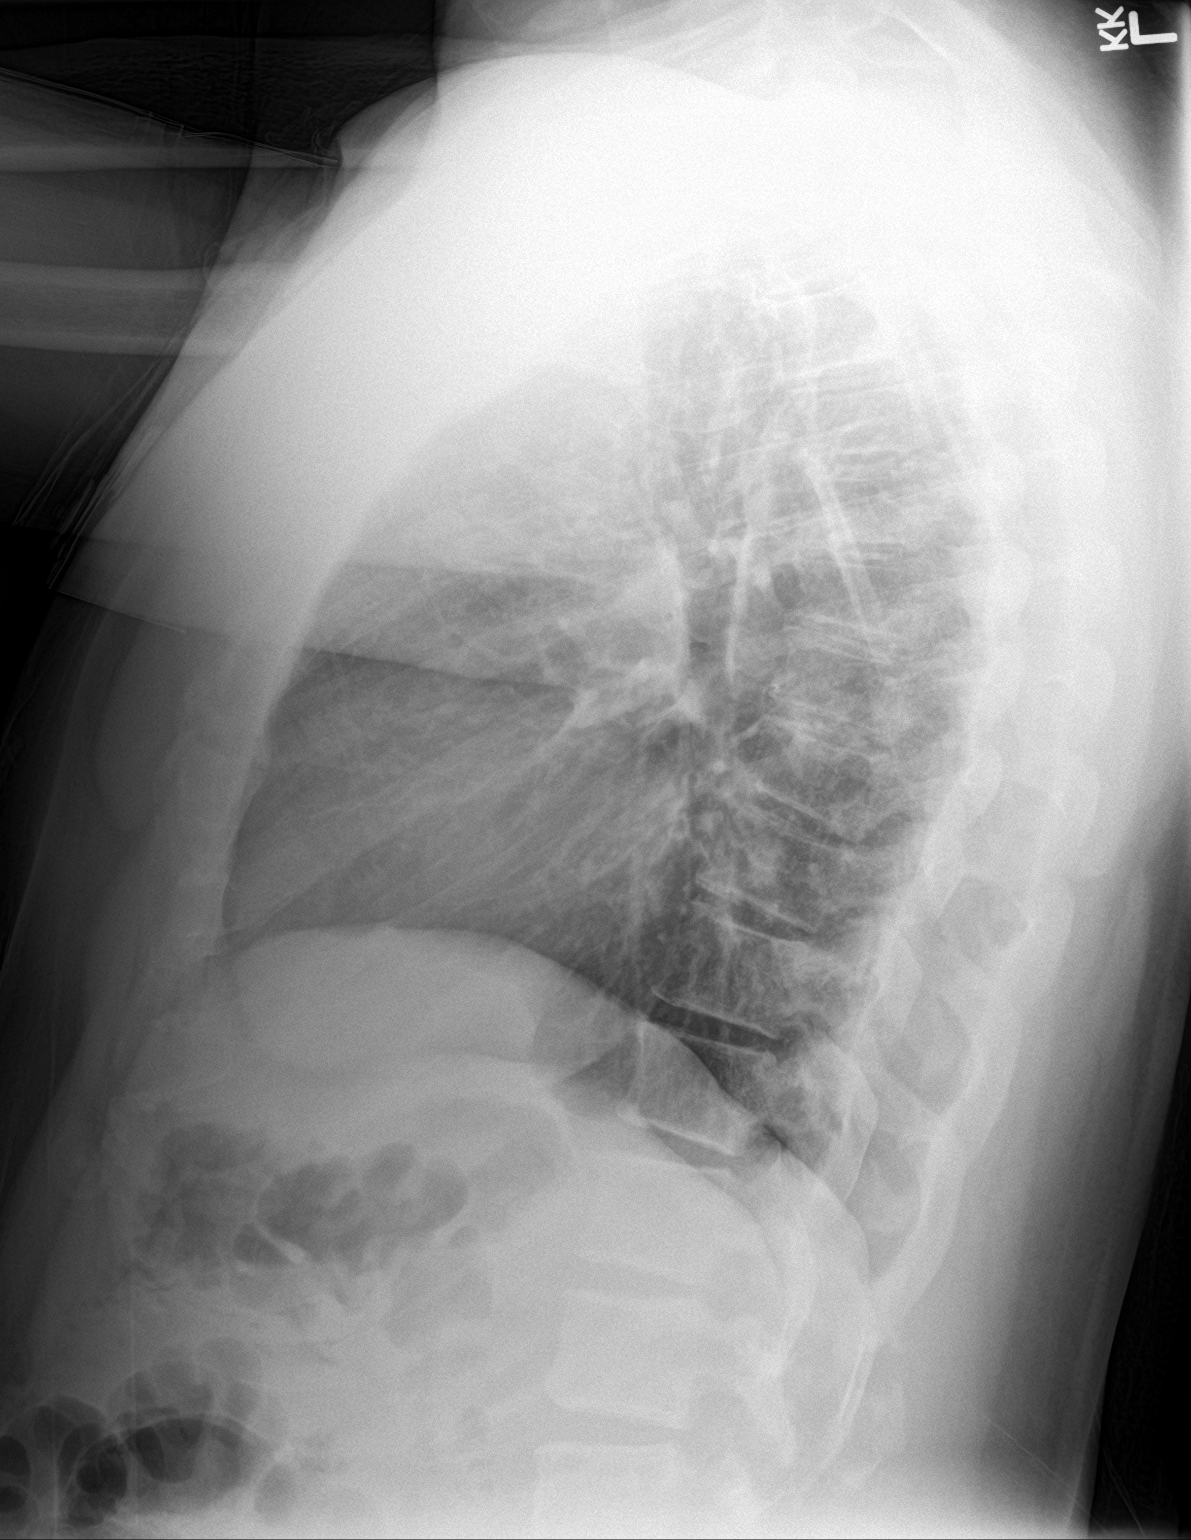

[2 of 2 positions shown; findings below may reference images not displayed]

FINDINGS: The Cardiac silhouette is mildly enlarged. No mediastinal or hilar
masses. No adenopathy.

Clear lungs. No pleural effusion. No pneumothorax.

Skeletal structures are intact.
IMPRESSION: No active cardiopulmonary disease.

## 2024-01-24 ENCOUNTER — Telehealth: Payer: Self-pay | Admitting: Family

## 2024-01-24 NOTE — Progress Notes (Deleted)
 Advanced Heart Failure Clinic Note   PCP: [facility]  Primary Cardiologist: Patricia Bare, MD   Chief Complaint:    HPI:  Andrew Walters is a 43 y/o male with a history of HTN, depression, current tobacco use, substance use, hyperlipidemia, CKD and chronic heart failure.   Echo 04/29/21: EF of 25-30% along with mild LVH, severe LAE and mild Andrew.   Echo 05/18/22: EF 55% along with mild AR.   Admitted 04/19/23 with acute onset of syncope while standing in the kitchen and assisting his wife cooking. He admitted to hitting the right side of his head with minimal contusion without laceration. CT head negative. EKG without significant rhythm disturbance and no events on tele monitoring. Carotid u/s negative for high-grade stenosis, TTE 04/19/23: with normal (recovered) EF 50-55%, moderate LVH, G1DD, normal RV. Labs stable from baseline. Patient was recently started on percocet and flomax  for groin pain and ct showing a 2 mm nonobstructing right renal calculus so either of these meds could have contributed to syncope. UDS positive for cocaine.   Since 09/24, he's had >10 ED visits for various complaints.    He presents today for a HF follow-up visit with a chief complaint of    ROS: All systems negative except what is listed in HPI, PMH and Problem List   Past Medical History:  Diagnosis Date   CHF (congestive heart failure) (HCC)    Hypertension     Current Outpatient Medications  Medication Sig Dispense Refill   atorvastatin  (LIPITOR) 40 MG tablet Take 1 tablet by mouth daily.     empagliflozin  (JARDIANCE ) 10 MG TABS tablet Take 1 tablet (10 mg total) by mouth daily. 90 tablet 3   metoprolol  succinate (TOPROL -XL) 100 MG 24 hr tablet Take 1 tablet (100 mg total) by mouth daily. 90 tablet 3   potassium chloride  SA (KLOR-CON  M) 20 MEQ tablet Take 2 tablets (40 mEq total) by mouth 2 (two) times daily. 120 tablet 5   sacubitril -valsartan  (ENTRESTO ) 49-51 MG Take 1 tablet  by mouth 2 (two) times daily. 180 tablet 3   spironolactone  (ALDACTONE ) 25 MG tablet Take 1 tablet (25 mg total) by mouth daily. 90 tablet 3   No current facility-administered medications for this visit.    No Known Allergies    Social History   Socioeconomic History   Marital status: Single    Spouse name: Not on file   Number of children: Not on file   Years of education: Not on file   Highest education level: Not on file  Occupational History   Not on file  Tobacco Use   Smoking status: Every Day    Types: Cigarettes   Smokeless tobacco: Never  Vaping Use   Vaping status: Never Used  Substance and Sexual Activity   Alcohol use: No   Drug use: Not Currently    Types: Marijuana   Sexual activity: Not on file  Other Topics Concern   Not on file  Social History Narrative   Not on file   Social Drivers of Health   Financial Resource Strain: High Risk (01/22/2022)   Overall Financial Resource Strain (CARDIA)    Difficulty of Paying Living Expenses: Very hard  Food Insecurity: Patient Declined (04/19/2023)   Hunger Vital Sign    Worried About Running Out of Food in the Last Year: Patient declined    Ran Out of Food in the Last Year: Patient declined  Transportation Needs: No Transportation Needs (04/19/2023)  PRAPARE - Administrator, Civil Service (Medical): No    Lack of Transportation (Non-Medical): No  Physical Activity: Not on file  Stress: Not on file  Social Connections: Not on file  Intimate Partner Violence: Patient Declined (04/19/2023)   Humiliation, Afraid, Rape, and Kick questionnaire    Fear of Current or Ex-Partner: Patient declined    Emotionally Abused: Patient declined    Physically Abused: Patient declined    Sexually Abused: Patient declined      Family History  Problem Relation Age of Onset   Sleep apnea Brother     There were no vitals filed for this visit.   PHYSICAL EXAM: General:  Well appearing. No respiratory  difficulty HEENT: normal Neck: supple. no JVD. Carotids 2+ bilat; no bruits. No lymphadenopathy or thyromegaly appreciated. Cor: PMI nondisplaced. Regular rate & rhythm. No rubs, gallops or murmurs. Lungs: clear Abdomen: soft, nontender, nondistended. No hepatosplenomegaly. No bruits or masses. Good bowel sounds. Extremities: no cyanosis, clubbing, rash, edema Neuro: alert & oriented x 3, cranial nerves grossly intact. moves all 4 extremities w/o difficulty. Affect pleasant.  ECG:   ASSESSMENT & PLAN:  1: NICM with preserved ejection fraction- - suspect due to HTN/ untreated OSA - NYHA class III - euvolemic - not weighing daily; encouraged to resume and to call for an overnight weight gain of > 2 pounds or a weekly weight gain of >5 pounds - weight up 2 pounds from last visit here 3 months ago - Echo 04/29/21: EF of 25-30% along with mild LVH, severe LAE and mild Andrew.  - Echo 05/18/22: EF 55% along with mild AR.  - TTE 04/19/23: with normal (recovered) EF 50-55%, moderate LVH, G1DD, normal RV.  - continue - unclear how much fluid he drinks and he can't quantify it; stays thirsty - saw HF cardiology Carlee) 08/24 - BNP 10/26/22 was 28.7  2: HTN- - BP  - sees PCP @ [facility]  - BMET 12/22/23 reviewed: sodium 136, creatinine 1.6, GFR 54  3: Tobacco use- - denies tobacco for ~ several months - denies alcohol/drug use - UDS + cocaine 11/24  4: Depression/ stress- - endorses a lot of stress as he deals with the health of the mother of his 2 young kids - emotional support given  5: Snoring- - saw pulmonology Nell) 01/24 & was supposed to get a home sleep study set up - will check at next visit to see if his insurance will pay for a home sleep study     Ellouise DELENA Class, FNP 01/24/24

## 2024-01-24 NOTE — Telephone Encounter (Signed)
 Called to confirm/remind patient of their appointment at the [facility] on 01/25/24.   Appointment:   [] Confirmed  [] Left mess   [] No answer/No voice mail  [x] VM Full/unable to leave message  [] Phone not in service  Patient reminded to bring all medications and/or complete list.  Confirmed patient has transportation. Gave directions, instructed to utilize valet parking. '

## 2024-01-25 ENCOUNTER — Encounter: Admitting: Family
